# Patient Record
Sex: Male | Born: 1940 | Race: White | Hispanic: Refuse to answer | Marital: Married | Smoking: Never smoker
Health system: Southern US, Community
[De-identification: ages and names within clinical notes are randomized; demographics above are authoritative.]

## PROBLEM LIST (undated history)

## (undated) DIAGNOSIS — C61 Malignant neoplasm of prostate: Secondary | ICD-10-CM

## (undated) DIAGNOSIS — Z973 Presence of spectacles and contact lenses: Secondary | ICD-10-CM

## (undated) DIAGNOSIS — I10 Essential (primary) hypertension: Secondary | ICD-10-CM

## (undated) DIAGNOSIS — Z86010 Personal history of colonic polyps: Secondary | ICD-10-CM

## (undated) DIAGNOSIS — M1990 Unspecified osteoarthritis, unspecified site: Secondary | ICD-10-CM

## (undated) DIAGNOSIS — K4090 Unilateral inguinal hernia, without obstruction or gangrene, not specified as recurrent: Secondary | ICD-10-CM

## (undated) DIAGNOSIS — K5730 Diverticulosis of large intestine without perforation or abscess without bleeding: Secondary | ICD-10-CM

## (undated) DIAGNOSIS — E785 Hyperlipidemia, unspecified: Secondary | ICD-10-CM

## (undated) HISTORY — PX: COLONOSCOPY W/ POLYPECTOMY: SHX1380

## (undated) HISTORY — DX: Hyperlipidemia, unspecified: E78.5

## (undated) HISTORY — DX: Essential (primary) hypertension: I10

## (undated) HISTORY — PX: TONSILLECTOMY: SUR1361

---

## 2002-02-26 ENCOUNTER — Ambulatory Visit (HOSPITAL_COMMUNITY): Admission: RE | Admit: 2002-02-26 | Discharge: 2002-02-26 | Payer: Self-pay | Admitting: Internal Medicine

## 2005-03-07 ENCOUNTER — Ambulatory Visit: Payer: Self-pay | Admitting: Internal Medicine

## 2005-03-24 ENCOUNTER — Ambulatory Visit: Payer: Self-pay | Admitting: Internal Medicine

## 2010-03-04 ENCOUNTER — Encounter: Payer: Self-pay | Admitting: Internal Medicine

## 2010-08-09 NOTE — Letter (Signed)
Summary: Colonoscopy Letter  [facility]  [address] [place], [place] [address]   Phone: [phone]  Fax: [phone]      March 04, 2010 MRN: [id number]   Robert [name] [address] [place], [place]  [address]   Dear [name],   According to your medical record, it is time for you to schedule a Colonoscopy. The American Cancer Society recommends this procedure as a method to detect early colon cancer. Patients with a family history of colon cancer, or a personal history of colon polyps or inflammatory bowel disease are at increased risk.  This letter has been generated based on the recommendations made at the time of your procedure. If you feel that in your particular situation this may no longer apply, please contact our office.  Please call our office at [phone] to schedule this appointment or to update your records at your earliest convenience.  Thank you for cooperating with [place] to provide you with the very best care possible.   Sincerely,  Wilhemina Bonito. Marina Goodell, M.D.  [facility] [phone]

## 2012-03-26 ENCOUNTER — Encounter: Payer: Self-pay | Admitting: Internal Medicine

## 2012-04-02 ENCOUNTER — Encounter: Payer: Self-pay | Admitting: Internal Medicine

## 2014-07-29 ENCOUNTER — Encounter: Payer: Self-pay | Admitting: Internal Medicine

## 2014-08-04 ENCOUNTER — Ambulatory Visit (AMBULATORY_SURGERY_CENTER): Payer: Self-pay

## 2014-08-04 VITALS — Ht 70.0 in | Wt 215.6 lb

## 2014-08-04 DIAGNOSIS — Z86010 Personal history of colonic polyps: Secondary | ICD-10-CM

## 2014-08-04 MED ORDER — MOVIPREP 100 G PO SOLR: ORAL | Status: DC

## 2014-08-04 NOTE — Progress Notes (Signed)
Per pt, no allergies to soy or egg products.Pt not taking any weight loss meds or using  O2 at home. 

## 2014-08-12 ENCOUNTER — Encounter: Payer: Self-pay | Admitting: Internal Medicine

## 2014-08-12 ENCOUNTER — Ambulatory Visit (AMBULATORY_SURGERY_CENTER): Payer: Medicare PPO | Admitting: Internal Medicine

## 2014-08-12 VITALS — BP 103/73 | HR 73 | Temp 98.6°F | Resp 19 | Ht 70.0 in | Wt 215.0 lb

## 2014-08-12 DIAGNOSIS — K5730 Diverticulosis of large intestine without perforation or abscess without bleeding: Secondary | ICD-10-CM

## 2014-08-12 DIAGNOSIS — D122 Benign neoplasm of ascending colon: Secondary | ICD-10-CM

## 2014-08-12 DIAGNOSIS — Z86010 Personal history of colonic polyps: Secondary | ICD-10-CM

## 2014-08-12 DIAGNOSIS — Z1211 Encounter for screening for malignant neoplasm of colon: Secondary | ICD-10-CM | POA: Diagnosis not present

## 2014-08-12 HISTORY — DX: Diverticulosis of large intestine without perforation or abscess without bleeding: K57.30

## 2014-08-12 HISTORY — PX: COLONOSCOPY W/ POLYPECTOMY: SHX1380

## 2014-08-12 MED ORDER — SODIUM CHLORIDE 0.9 % IV SOLN
500.0000 mL | INTRAVENOUS | Status: DC
Start: 2014-08-12 — End: 2014-08-12

## 2014-08-12 NOTE — Op Note (Signed)
[place]  [address] [place], [address]   COLONOSCOPY PROCEDURE REPORT  PATIENT: [name], Robert  MR#: [id number] BIRTHDATE: Oct 16, 1940 , 73  yrs. old GENDER: male ENDOSCOPIST: Eustace Quail, MD REFERRED OZ:[id number] Program Recall PROCEDURE DATE:  08/12/2014 PROCEDURE:   Colonoscopy with snare polypectomy x 1 First Screening Colonoscopy - Avg.  risk and is 50 yrs.  old or older - No.  Prior Negative Screening - Now for repeat screening. N/A  History of Adenoma - Now for follow-up colonoscopy & has been > or = to 3 yrs.  Yes hx of adenoma.  Has been 3 or more years since last colonoscopy.  Polyps Removed Today? Yes. ASA CLASS:   Class II INDICATIONS:surveillance colonoscopy based on a history of adenomatous colonic polyp(s).. Prior examination 2003 (tubular adenoma) and 2006 (no neoplasia) MEDICATIONS: Monitored anesthesia care and Propofol 200 mg IV  DESCRIPTION OF PROCEDURE:   After the risks benefits and alternatives of the procedure were thoroughly explained, informed consent was obtained.  The digital rectal exam revealed no abnormalities of the rectum.   The LB [id number]  endoscope was introduced through the anus and advanced to the cecum, which was identified by both the appendix and ileocecal valve. No adverse events experienced.   The quality of the prep was excellent, using MoviPrep  The instrument was then slowly withdrawn as the colon was fully examined.  COLON FINDINGS: A single polyp measuring 5 mm in size was found in the ascending colon.  A polypectomy was performed with a cold snare.  The resection was complete, the polyp tissue was completely retrieved and sent to histology.   There was moderate diverticulosis noted in the sigmoid colon.   The examination was otherwise normal.  Retroflexed views revealed internal hemorrhoids. The time to cecum=1 minutes 59 seconds.  Withdrawal time=11 minutes 21 seconds.  The scope was  withdrawn and the procedure completed. COMPLICATIONS: There were no immediate complications.  ENDOSCOPIC IMPRESSION: 1.   Single polyp measuring 5 mm in size was found in the ascending colon; polypectomy was performed with a cold snare 2.   Moderate diverticulosis was noted in the sigmoid colon 3.   The examination was otherwise normal  RECOMMENDATIONS: 1. Follow up colonoscopy in 5 years  eSigned:  Eustace Quail, MD 08/12/2014 10:43 AM   cc: Leanna Battles, MD and The Patient

## 2014-08-12 NOTE — Patient Instructions (Signed)
YOU HAD AN ENDOSCOPIC PROCEDURE TODAY AT THE [facility]: Refer to the procedure report that was given to you for any specific questions about what was found during the examination.  If the procedure report does not answer your questions, please call your gastroenterologist to clarify.  If you requested that your care partner not be given the details of your procedure findings, then the procedure report has been included in a sealed envelope for you to review at your convenience later.  YOU SHOULD EXPECT: Some feelings of bloating in the abdomen. Passage of more gas than usual.  Walking can help get rid of the air that was put into your GI tract during the procedure and reduce the bloating. If you had a lower endoscopy (such as a colonoscopy or flexible sigmoidoscopy) you may notice spotting of blood in your stool or on the toilet paper. If you underwent a bowel prep for your procedure, then you may not have a normal bowel movement for a few days.  DIET: Your first meal following the procedure should be a light meal and then it is ok to progress to your normal diet.  A half-sandwich or bowl of soup is an example of a good first meal.  Heavy or fried foods are harder to digest and may make you feel nauseous or bloated.  Likewise meals heavy in dairy and vegetables can cause extra gas to form and this can also increase the bloating.  Drink plenty of fluids but you should avoid alcoholic beverages for 24 hours.  ACTIVITY: Your care partner should take you home directly after the procedure.  You should plan to take it easy, moving slowly for the rest of the day.  You can resume normal activity the day after the procedure however you should NOT DRIVE or use heavy machinery for 24 hours (because of the sedation medicines used during the test).    SYMPTOMS TO REPORT IMMEDIATELY: A gastroenterologist can be reached at any hour.  During normal business hours, 8:30 AM to 5:00 PM Monday through Friday,  call [phone].  After hours and on weekends, please call the GI answering service at [phone] who will take a message and have the physician on call contact you.   Following lower endoscopy (colonoscopy or flexible sigmoidoscopy):  Excessive amounts of blood in the stool  Significant tenderness or worsening of abdominal pains  Swelling of the abdomen that is new, acute  Fever of 100F or higher   FOLLOW UP: If any biopsies were taken you will be contacted by phone or by letter within the next 1-3 weeks.  Call your gastroenterologist if you have not heard about the biopsies in 3 weeks.  Our staff will call the home number listed on your records the next business day following your procedure to check on you and address any questions or concerns that you may have at that time regarding the information given to you following your procedure. This is a courtesy call and so if there is no answer at the home number and we have not heard from you through the emergency physician on call, we will assume that you have returned to your regular daily activities without incident.  SIGNATURES/CONFIDENTIALITY: You and/or your care partner have signed paperwork which will be entered into your electronic medical record.  These signatures attest to the fact that that the information above on your After Visit Summary has been reviewed and is understood.  Full responsibility of the confidentiality of   this discharge information lies with you and/or your care-partner.   Resume medications. Information given on polyps,diverticulosis and high fiber diet with discharge instructions. 

## 2014-08-12 NOTE — Progress Notes (Signed)
A/ox3 pleased with MAC, report to Sheila RN 

## 2014-08-12 NOTE — Progress Notes (Signed)
Called to room to assist during endoscopic procedure.  Patient ID and intended procedure confirmed with present staff. Received instructions for my participation in the procedure from the performing physician.  

## 2014-08-13 ENCOUNTER — Telehealth: Payer: Self-pay | Admitting: *Deleted

## 2014-08-13 NOTE — Telephone Encounter (Signed)
  Follow up Call-  Call back number 08/12/2014  Post procedure Call Back phone  # [phone]  Permission to leave phone message Yes     Patient questions:  Do you have a fever, pain , or abdominal swelling? No. Pain Score  0 *  Have you tolerated food without any problems? Yes.    Have you been able to return to your normal activities? Yes.    Do you have any questions about your discharge instructions: Diet   No. Medications  No. Follow up visit  No.  Do you have questions or concerns about your Care? No.  Actions: * If pain score is 4 or above: No action needed, pain <4.

## 2014-08-18 ENCOUNTER — Encounter: Payer: Self-pay | Admitting: Internal Medicine

## 2015-01-25 DIAGNOSIS — D1801 Hemangioma of skin and subcutaneous tissue: Secondary | ICD-10-CM | POA: Diagnosis not present

## 2015-01-25 DIAGNOSIS — L814 Other melanin hyperpigmentation: Secondary | ICD-10-CM | POA: Diagnosis not present

## 2015-01-25 DIAGNOSIS — L821 Other seborrheic keratosis: Secondary | ICD-10-CM | POA: Diagnosis not present

## 2015-01-25 DIAGNOSIS — D225 Melanocytic nevi of trunk: Secondary | ICD-10-CM | POA: Diagnosis not present

## 2015-04-03 DIAGNOSIS — Z23 Encounter for immunization: Secondary | ICD-10-CM | POA: Diagnosis not present

## 2015-08-02 DIAGNOSIS — R8299 Other abnormal findings in urine: Secondary | ICD-10-CM | POA: Diagnosis not present

## 2015-08-02 DIAGNOSIS — I10 Essential (primary) hypertension: Secondary | ICD-10-CM | POA: Diagnosis not present

## 2015-08-02 DIAGNOSIS — E784 Other hyperlipidemia: Secondary | ICD-10-CM | POA: Diagnosis not present

## 2015-08-02 DIAGNOSIS — R7302 Impaired glucose tolerance (oral): Secondary | ICD-10-CM | POA: Diagnosis not present

## 2015-08-02 DIAGNOSIS — Z125 Encounter for screening for malignant neoplasm of prostate: Secondary | ICD-10-CM | POA: Diagnosis not present

## 2015-08-09 DIAGNOSIS — Z6831 Body mass index (BMI) 31.0-31.9, adult: Secondary | ICD-10-CM | POA: Diagnosis not present

## 2015-08-09 DIAGNOSIS — I10 Essential (primary) hypertension: Secondary | ICD-10-CM | POA: Diagnosis not present

## 2015-08-09 DIAGNOSIS — Z1389 Encounter for screening for other disorder: Secondary | ICD-10-CM | POA: Diagnosis not present

## 2015-08-09 DIAGNOSIS — L989 Disorder of the skin and subcutaneous tissue, unspecified: Secondary | ICD-10-CM | POA: Diagnosis not present

## 2015-08-09 DIAGNOSIS — E784 Other hyperlipidemia: Secondary | ICD-10-CM | POA: Diagnosis not present

## 2015-08-09 DIAGNOSIS — R3129 Other microscopic hematuria: Secondary | ICD-10-CM | POA: Diagnosis not present

## 2015-08-09 DIAGNOSIS — E668 Other obesity: Secondary | ICD-10-CM | POA: Diagnosis not present

## 2015-08-09 DIAGNOSIS — R7302 Impaired glucose tolerance (oral): Secondary | ICD-10-CM | POA: Diagnosis not present

## 2015-08-09 DIAGNOSIS — Z0000 Encounter for general adult medical examination without abnormal findings: Secondary | ICD-10-CM | POA: Diagnosis not present

## 2015-08-16 DIAGNOSIS — Z1212 Encounter for screening for malignant neoplasm of rectum: Secondary | ICD-10-CM | POA: Diagnosis not present

## 2015-09-28 DIAGNOSIS — H524 Presbyopia: Secondary | ICD-10-CM | POA: Diagnosis not present

## 2015-09-28 DIAGNOSIS — H53021 Refractive amblyopia, right eye: Secondary | ICD-10-CM | POA: Diagnosis not present

## 2015-09-28 DIAGNOSIS — H25813 Combined forms of age-related cataract, bilateral: Secondary | ICD-10-CM | POA: Diagnosis not present

## 2016-03-25 DIAGNOSIS — Z23 Encounter for immunization: Secondary | ICD-10-CM | POA: Diagnosis not present

## 2016-08-07 DIAGNOSIS — I10 Essential (primary) hypertension: Secondary | ICD-10-CM | POA: Diagnosis not present

## 2016-08-07 DIAGNOSIS — Z125 Encounter for screening for malignant neoplasm of prostate: Secondary | ICD-10-CM | POA: Diagnosis not present

## 2016-08-07 DIAGNOSIS — R8299 Other abnormal findings in urine: Secondary | ICD-10-CM | POA: Diagnosis not present

## 2016-08-07 DIAGNOSIS — E784 Other hyperlipidemia: Secondary | ICD-10-CM | POA: Diagnosis not present

## 2016-08-07 DIAGNOSIS — R7302 Impaired glucose tolerance (oral): Secondary | ICD-10-CM | POA: Diagnosis not present

## 2016-08-14 DIAGNOSIS — E668 Other obesity: Secondary | ICD-10-CM | POA: Diagnosis not present

## 2016-08-14 DIAGNOSIS — R972 Elevated prostate specific antigen [PSA]: Secondary | ICD-10-CM | POA: Diagnosis not present

## 2016-08-14 DIAGNOSIS — E784 Other hyperlipidemia: Secondary | ICD-10-CM | POA: Diagnosis not present

## 2016-08-14 DIAGNOSIS — R7302 Impaired glucose tolerance (oral): Secondary | ICD-10-CM | POA: Diagnosis not present

## 2016-08-14 DIAGNOSIS — Z0000 Encounter for general adult medical examination without abnormal findings: Secondary | ICD-10-CM | POA: Diagnosis not present

## 2016-08-14 DIAGNOSIS — I10 Essential (primary) hypertension: Secondary | ICD-10-CM | POA: Diagnosis not present

## 2016-08-14 DIAGNOSIS — Z1389 Encounter for screening for other disorder: Secondary | ICD-10-CM | POA: Diagnosis not present

## 2016-08-14 DIAGNOSIS — Z6831 Body mass index (BMI) 31.0-31.9, adult: Secondary | ICD-10-CM | POA: Diagnosis not present

## 2016-08-16 DIAGNOSIS — Z1212 Encounter for screening for malignant neoplasm of rectum: Secondary | ICD-10-CM | POA: Diagnosis not present

## 2016-09-08 DIAGNOSIS — R3129 Other microscopic hematuria: Secondary | ICD-10-CM | POA: Diagnosis not present

## 2016-09-08 DIAGNOSIS — I10 Essential (primary) hypertension: Secondary | ICD-10-CM | POA: Diagnosis not present

## 2016-09-08 DIAGNOSIS — R972 Elevated prostate specific antigen [PSA]: Secondary | ICD-10-CM | POA: Diagnosis not present

## 2016-09-08 DIAGNOSIS — Z6831 Body mass index (BMI) 31.0-31.9, adult: Secondary | ICD-10-CM | POA: Diagnosis not present

## 2016-12-14 DIAGNOSIS — H53021 Refractive amblyopia, right eye: Secondary | ICD-10-CM | POA: Diagnosis not present

## 2016-12-14 DIAGNOSIS — H2513 Age-related nuclear cataract, bilateral: Secondary | ICD-10-CM | POA: Diagnosis not present

## 2016-12-14 DIAGNOSIS — H524 Presbyopia: Secondary | ICD-10-CM | POA: Diagnosis not present

## 2016-12-14 DIAGNOSIS — H5203 Hypermetropia, bilateral: Secondary | ICD-10-CM | POA: Diagnosis not present

## 2017-07-10 DIAGNOSIS — C61 Malignant neoplasm of prostate: Secondary | ICD-10-CM

## 2017-07-10 HISTORY — DX: Malignant neoplasm of prostate: C61

## 2017-08-10 DIAGNOSIS — E7849 Other hyperlipidemia: Secondary | ICD-10-CM | POA: Diagnosis not present

## 2017-08-10 DIAGNOSIS — R82998 Other abnormal findings in urine: Secondary | ICD-10-CM | POA: Diagnosis not present

## 2017-08-10 DIAGNOSIS — I10 Essential (primary) hypertension: Secondary | ICD-10-CM | POA: Diagnosis not present

## 2017-08-10 DIAGNOSIS — R7302 Impaired glucose tolerance (oral): Secondary | ICD-10-CM | POA: Diagnosis not present

## 2017-08-10 DIAGNOSIS — Z125 Encounter for screening for malignant neoplasm of prostate: Secondary | ICD-10-CM | POA: Diagnosis not present

## 2017-08-15 DIAGNOSIS — Z1212 Encounter for screening for malignant neoplasm of rectum: Secondary | ICD-10-CM | POA: Diagnosis not present

## 2017-08-17 DIAGNOSIS — Z6831 Body mass index (BMI) 31.0-31.9, adult: Secondary | ICD-10-CM | POA: Diagnosis not present

## 2017-08-17 DIAGNOSIS — Z0000 Encounter for general adult medical examination without abnormal findings: Secondary | ICD-10-CM | POA: Diagnosis not present

## 2017-08-17 DIAGNOSIS — Z1389 Encounter for screening for other disorder: Secondary | ICD-10-CM | POA: Diagnosis not present

## 2017-08-17 DIAGNOSIS — R7301 Impaired fasting glucose: Secondary | ICD-10-CM | POA: Diagnosis not present

## 2017-08-17 DIAGNOSIS — E7849 Other hyperlipidemia: Secondary | ICD-10-CM | POA: Diagnosis not present

## 2017-08-17 DIAGNOSIS — E668 Other obesity: Secondary | ICD-10-CM | POA: Diagnosis not present

## 2017-08-17 DIAGNOSIS — I10 Essential (primary) hypertension: Secondary | ICD-10-CM | POA: Diagnosis not present

## 2017-08-17 DIAGNOSIS — R972 Elevated prostate specific antigen [PSA]: Secondary | ICD-10-CM | POA: Diagnosis not present

## 2017-09-20 DIAGNOSIS — R311 Benign essential microscopic hematuria: Secondary | ICD-10-CM | POA: Diagnosis not present

## 2017-09-20 DIAGNOSIS — R972 Elevated prostate specific antigen [PSA]: Secondary | ICD-10-CM | POA: Diagnosis not present

## 2017-10-16 DIAGNOSIS — R972 Elevated prostate specific antigen [PSA]: Secondary | ICD-10-CM | POA: Diagnosis not present

## 2017-10-16 DIAGNOSIS — C61 Malignant neoplasm of prostate: Secondary | ICD-10-CM | POA: Diagnosis not present

## 2017-10-16 HISTORY — PX: PROSTATE BIOPSY: SHX241

## 2017-11-15 DIAGNOSIS — C61 Malignant neoplasm of prostate: Secondary | ICD-10-CM | POA: Diagnosis not present

## 2017-12-07 ENCOUNTER — Encounter: Payer: Self-pay | Admitting: Radiation Oncology

## 2017-12-07 NOTE — Progress Notes (Signed)
GU Location of Tumor / Histology: prostatic adenocarcinoma  If Prostate Cancer, Gleason Score is (3 + 4) and PSA is (7.17) on 09/10/2017. Prostate volume: 49 grams  Robert [name] has a history of BPH and microscopic hematuria in 2015. He underwent cystoscopy and ultrasound. Patient returned to Dr. Junious Silk in March 2019 at the advice of Dr. Leanna Battles for further evaluation of an elevated PSA.   Biopsies of prostate (if applicable) revealed:    Past/Anticipated interventions by urology, if any: cystoscopy, prostate biopsy, referral to radiation oncology  Past/Anticipated interventions by medical oncology, if any: no  Weight changes, if any: no  Bowel/Bladder complaints, if any: IPSS 3. Denies dysuria, hematuria, urinary leakage or incontinence.  Nausea/Vomiting, if any: no  Pain issues, if any:  See chiropractor regular for sciatica. Reports a sore low back from time to time.   SAFETY ISSUES:  Prior radiation? no  Pacemaker/ICD? no  Possible current pregnancy? no  Is the patient on methotrexate? no  Current Complaints / other details:  77 year old male. Married for 52 years.One daughter. 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

## 2017-12-10 ENCOUNTER — Encounter: Payer: Self-pay | Admitting: Radiation Oncology

## 2017-12-10 ENCOUNTER — Other Ambulatory Visit: Payer: Self-pay

## 2017-12-10 ENCOUNTER — Ambulatory Visit
Admission: RE | Admit: 2017-12-10 | Discharge: 2017-12-10 | Disposition: A | Discharge status: Home / Self Care | Payer: Medicare PPO | Source: Ambulatory Visit | Attending: Radiation Oncology | Admitting: Radiation Oncology

## 2017-12-10 ENCOUNTER — Encounter: Payer: Self-pay | Admitting: Medical Oncology

## 2017-12-10 VITALS — BP 133/86 | HR 81 | Temp 98.2°F | Resp 20 | Ht 69.5 in | Wt 208.4 lb

## 2017-12-10 DIAGNOSIS — Z79899 Other long term (current) drug therapy: Secondary | ICD-10-CM | POA: Insufficient documentation

## 2017-12-10 DIAGNOSIS — E785 Hyperlipidemia, unspecified: Secondary | ICD-10-CM | POA: Insufficient documentation

## 2017-12-10 DIAGNOSIS — C61 Malignant neoplasm of prostate: Secondary | ICD-10-CM | POA: Diagnosis not present

## 2017-12-10 DIAGNOSIS — Z87448 Personal history of other diseases of urinary system: Secondary | ICD-10-CM | POA: Diagnosis not present

## 2017-12-10 DIAGNOSIS — I10 Essential (primary) hypertension: Secondary | ICD-10-CM | POA: Diagnosis not present

## 2017-12-10 DIAGNOSIS — M545 Low back pain: Secondary | ICD-10-CM | POA: Diagnosis not present

## 2017-12-10 DIAGNOSIS — R972 Elevated prostate specific antigen [PSA]: Secondary | ICD-10-CM | POA: Diagnosis not present

## 2017-12-10 DIAGNOSIS — Z7982 Long term (current) use of aspirin: Secondary | ICD-10-CM | POA: Insufficient documentation

## 2017-12-10 HISTORY — DX: Malignant neoplasm of prostate: C61

## 2017-12-10 NOTE — Addendum Note (Signed)
Encounter addended by: Tyler Pita, MD on: 12/10/2017 8:39 PM  Actions taken: Medication List reviewed, Allergies reviewed, Problem List reviewed, LOS modified

## 2017-12-10 NOTE — Progress Notes (Signed)
Radiation Oncology         [phone] ________________________________  Initial outpatient Consultation  Name: Robert [name] MRN: [id number]  Date: 12/10/2017  DOB: Jan 20, 1941  CV:ELFYBOFB, Quillian Quince, MD  Festus Aloe, MD   REFERRING PHYSICIAN: Festus Aloe, MD  DIAGNOSIS: 77 y.o. gentleman with stage T1c adenocarcinoma of the prostate with a Gleason's score of 3+4 and a PSA of 7.17    ICD-10-CM   1. Malignant neoplasm of prostate Robert [name]) [facility] referral to Social Work    HISTORY OF PRESENT ILLNESS::Robert [name] is a 77 y.o. gentleman.  He was noted to have an elevated PSA of 7.17 by his primary care physician, Dr. Leanna Battles.  Accordingly, he was referred for evaluation in urology by Dr. Junious Silk in March who performed digital rectal exam that revealed estimated 40 g prostate but otherwise normal. The patient proceeded to transrectal ultrasound with 12 biopsies of the prostate on 10/16/2017.  The prostate volume measured to be 49 cc.  Out of 12 core biopsies, 3 were positive.  The maximum Gleason score was 3+4=7, and this was seen in the right apex.  The patient reviewed the biopsy results with his urologist and he has kindly been referred today for discussion of potential radiation treatment options.   On review of systems, patient has hx of BPH, microscopic hematuria evaluated with cystoscopy and [place] in 2015. Endorses intermittent low back pain.  PREVIOUS RADIATION THERAPY: No  PAST MEDICAL HISTORY:  has a past medical history of Hyperlipidemia, Hypertension, and Prostate cancer ([facility] Colon).    PAST SURGICAL HISTORY: Past Surgical History:  Procedure Laterality Date  . PROSTATE BIOPSY    . TONSILLECTOMY      FAMILY HISTORY: family history includes Cancer in his paternal grandfather.  SOCIAL HISTORY:  reports that he has never smoked. He has never used smokeless tobacco. He reports that he does not drink alcohol or use drugs.  ALLERGIES: Patient has no  known allergies.  MEDICATIONS:  Current Outpatient Medications  Medication Sig Dispense Refill  . aspirin 81 MG tablet Take 81 mg by mouth daily.    . metoprolol succinate (TOPROL-XL) 50 MG 24 hr tablet Take 50 mg by mouth daily. Take with or immediately following a meal.    . Multiple Vitamin (MULTIVITAMIN) tablet Take 1 tablet by mouth daily.    . simvastatin (ZOCOR) 40 MG tablet Take 40 mg by mouth daily.     No current facility-administered medications for this encounter.     REVIEW OF SYSTEMS:  A 15 point review of systems is documented in the electronic medical record. This was obtained by the nursing staff. However, I reviewed this with the patient to discuss relevant findings and make appropriate changes.  Pertinent items are noted in HPI..  The patient completed an IPSS and IIEF questionnaire.  His IPSS score was 3 indicating mild urinary outflow obstructive symptoms.  He indicated that his erectile function is almost never able to complete sexual activity.   PHYSICAL EXAM: This patient is in no acute distress.  He is alert and oriented.   height is 5' 9.5" (1.765 m) and weight is 208 lb 6.4 oz (94.5 kg). His oral temperature is 98.2 F (36.8 C). His blood pressure is 133/86 and his pulse is 81. His respiration is 20 and oxygen saturation is 97%.  He exhibits no respiratory distress or labored breathing.  He appears neurologically intact.  His mood is pleasant.  His affect is appropriate.  Please note the  digital rectal exam findings described above.  KPS = 100  100 - Normal; no complaints; no evidence of disease. 90   - Able to carry on normal activity; minor signs or symptoms of disease. 80   - Normal activity with effort; some signs or symptoms of disease. 31   - Cares for self; unable to carry on normal activity or to do active work. 60   - Requires occasional assistance, but is able to care for most of his personal needs. 50   - Requires considerable assistance and frequent  medical care. 37   - Disabled; requires special care and assistance. 46   - Severely disabled; hospital admission is indicated although death not imminent. 61   - Very sick; hospital admission necessary; active supportive treatment necessary. 10   - Moribund; fatal processes progressing rapidly. 0     - Dead  Karnofsky DA, Abelmann WH, Craver LS and Burchenal JH [phone]) The use of the nitrogen mustards in the palliative treatment of carcinoma: with particular reference to bronchogenic carcinoma Cancer 1 634-56   LABORATORY DATA:  No results found for: WBC, HGB, HCT, MCV, PLT No results found for: NA, K, CL, CO2 No results found for: ALT, AST, GGT, ALKPHOS, BILITOT   RADIOGRAPHY: No results found.    IMPRESSION: This gentleman is a 77 y.o. gentleman with stage T1c adenocarcinoma of the prostate with a Gleason's score of 3+4 and a PSA of 7.17.  His T-Stage, Gleason's Score, and PSA put him into the intermediate risk group.  Accordingly he is eligible for a variety of potential treatment options including  brachytherapy and external radiation therapy.   PLAN: Today I reviewed the findings and workup thus far.  We discussed the natural history of prostate cancer.  We reviewed the the implications of T-stage, Gleason's Score, and PSA on decision-making and outcomes in prostate cancer.  We discussed radiation treatment in the management of prostate cancer with regard to the logistics and delivery of external beam radiation treatment as well as the logistics and delivery of prostate brachytherapy.  We compared and contrasted each of these approaches and also compared these against prostatectomy.  The patient expressed interest in prostate brachytherapy.  I filled out a patient counseling form for him with relevant treatment diagrams and we retained a copy for our records.   The patient would like to proceed with prostate brachytherapy.  I will share my findings with Dr. Junious Silk and move forward with  scheduling the procedure in the near future.     I enjoyed meeting with him today, and will look forward to participating in the care of this very nice gentleman.   I spent time face to face with the patient and more than 50% of that time was spent in counseling and/or coordination of care.   ------------------------------------------------   Tyler Pita, MD [place] Director and Director of Stereotactic Radiosurgery Direct Dial: [phone]  Fax: [phone] [email or web address]  Skype  LinkedIn   Page Me   This document serves as a record of services personally performed by Tyler Pita, MD. It was created on his behalf by Linward Natal, a trained medical scribe. The creation of this record is based on the scribe's personal observations and the provider's statements to them. This document has been checked and approved by the attending provider.

## 2017-12-10 NOTE — Progress Notes (Signed)
Introduced myself to [name] and his daughter as the prostate nurse navigator and my role. He states his PSA has slowly been rising. He had a biopsy in March that reveal 3+4= 7. He is ready to get treatment and move on with his life. 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 He will meet with Dr. Tammi Klippel to discuss his options. If a candidate, he would like to pursue brachytherapy.

## 2017-12-10 NOTE — Progress Notes (Signed)
See progress note under physician encounter. 

## 2017-12-12 ENCOUNTER — Encounter: Payer: Self-pay | Admitting: General Practice

## 2017-12-12 NOTE — Progress Notes (Signed)
[facility] Psychosocial Distress Screening Clinical Social Work  Clinical Social Work was referred by distress screening protocol.  The patient scored a 5 on the Psychosocial Distress Thermometer which indicates moderate distress. Clinical Social Worker contacted patient by phone to assess for distress and other psychosocial needs. Patient is primary caregiver for wife; however, has daughter who helps out.  No concerns re transportation or services needed.  "I just want to get things going" but understands there will be a wait before his treatment can begin.  Reviewed briefly services available in [place], encouraged patient to call if needed.    ONCBCN DISTRESS SCREENING 12/10/2017  Screening Type Initial Screening  Distress experienced in past week (1-10) 5  Emotional problem type Nervousness/Anxiety  Physical Problem type Changes in urination  Physician notified of physical symptoms Yes  Referral to clinical psychology No  Referral to clinical social work No  Referral to dietition No  Referral to financial advocate No  Referral to support programs No  Referral to palliative care No  Other Primary caregiver for his wife. Phone number [phone]    Clinical Social Worker follow up needed: No.  If yes, follow up plan:  Beverely Pace, [place], LCSW Clinical Social Worker Phone:  [phone]

## 2017-12-14 ENCOUNTER — Telehealth: Payer: Self-pay | Admitting: *Deleted

## 2017-12-14 NOTE — Telephone Encounter (Signed)
Called patient to inform of pre-seed planning CT for 01-04-18, spoke with patient and he is aware of this appt.

## 2017-12-24 ENCOUNTER — Other Ambulatory Visit: Payer: Self-pay | Admitting: Urology

## 2018-01-03 ENCOUNTER — Telehealth: Payer: Self-pay | Admitting: *Deleted

## 2018-01-03 DIAGNOSIS — H25013 Cortical age-related cataract, bilateral: Secondary | ICD-10-CM | POA: Diagnosis not present

## 2018-01-03 DIAGNOSIS — H5203 Hypermetropia, bilateral: Secondary | ICD-10-CM | POA: Diagnosis not present

## 2018-01-03 DIAGNOSIS — H53021 Refractive amblyopia, right eye: Secondary | ICD-10-CM | POA: Diagnosis not present

## 2018-01-03 DIAGNOSIS — H52223 Regular astigmatism, bilateral: Secondary | ICD-10-CM | POA: Diagnosis not present

## 2018-01-03 DIAGNOSIS — H2513 Age-related nuclear cataract, bilateral: Secondary | ICD-10-CM | POA: Diagnosis not present

## 2018-01-03 NOTE — Telephone Encounter (Signed)
CALLED PATIENT TO REMIND OF PRE-SEED AND CHEST X-RAY AND EKG FOR 01-04-18, SPOKE WITH PATIENT AND HE IS AWARE OF THESE APPTS.

## 2018-01-04 ENCOUNTER — Ambulatory Visit
Admission: RE | Admit: 2018-01-04 | Discharge: 2018-01-04 | Disposition: A | Discharge status: Home / Self Care | Payer: Medicare PPO | Source: Ambulatory Visit | Attending: Radiation Oncology | Admitting: Radiation Oncology

## 2018-01-04 ENCOUNTER — Encounter (HOSPITAL_COMMUNITY)
Admission: RE | Admit: 2018-01-04 | Discharge: 2018-01-04 | Disposition: A | Discharge status: Home / Self Care | Payer: Medicare PPO | Source: Ambulatory Visit | Attending: Urology | Admitting: Urology

## 2018-01-04 ENCOUNTER — Ambulatory Visit (HOSPITAL_COMMUNITY)
Admission: RE | Admit: 2018-01-04 | Discharge: 2018-01-04 | Disposition: A | Discharge status: Home / Self Care | Payer: Medicare PPO | Source: Ambulatory Visit | Attending: Urology | Admitting: Urology

## 2018-01-04 VITALS — BP 119/85 | HR 91 | Temp 98.3°F | Resp 20 | Wt 208.0 lb

## 2018-01-04 DIAGNOSIS — C61 Malignant neoplasm of prostate: Secondary | ICD-10-CM

## 2018-01-04 DIAGNOSIS — R918 Other nonspecific abnormal finding of lung field: Secondary | ICD-10-CM | POA: Diagnosis not present

## 2018-01-04 DIAGNOSIS — Z01818 Encounter for other preprocedural examination: Secondary | ICD-10-CM | POA: Diagnosis not present

## 2018-01-04 NOTE — Progress Notes (Signed)
  Radiation Oncology         [phone]) [phone] ________________________________  Name: Robert [name] MRN: [id number]  Date: 01/04/2018  DOB: 01-05-1941  SIMULATION AND TREATMENT PLANNING NOTE PUBIC ARCH STUDY  GQ:[id number], Quillian Quince, MD  Festus Aloe, MD  DIAGNOSIS: 78 y.o. gentleman with stage T1c adenocarcinoma of the prostate with a Gleason's score of 3+4 and a PSA of 7.17     ICD-10-CM   1. Malignant neoplasm of prostate ([place]) C61     COMPLEX SIMULATION:  The patient presented today for evaluation for possible prostate seed implant. He was brought to the radiation planning suite and placed supine on the CT couch. A 3-dimensional image study set was obtained in upload to the planning computer. There, on each axial slice, I contoured the prostate gland. Then, using three-dimensional radiation planning tools I reconstructed the prostate in view of the structures from the transperineal needle pathway to assess for possible pubic arch interference. In doing so, I did not appreciate any pubic arch interference. Also, the patient's prostate volume was estimated based on the drawn structure. The volume was 52 cc.  For comparison, TRUS volume was 49 cc.  Given the pubic arch appearance and prostate volume, patient remains a good candidate to proceed with prostate seed implant. Today, he freely provided informed written consent to proceed.    PLAN: The patient will undergo prostate seed implant.   ________________________________  Sheral Apley. Tammi Klippel, M.D.

## 2018-02-19 ENCOUNTER — Other Ambulatory Visit: Payer: Self-pay | Admitting: Urology

## 2018-02-19 DIAGNOSIS — C61 Malignant neoplasm of prostate: Secondary | ICD-10-CM

## 2018-02-28 ENCOUNTER — Telehealth: Payer: Self-pay | Admitting: *Deleted

## 2018-02-28 NOTE — Telephone Encounter (Signed)
CALLED PATIENT TO REMIND OF LAB APPT. FOR 03-01-18 - ARRIVAL TIME - 8:45 AM @ [facility] ADMITTING, LVM FOR A RETURN CALL

## 2018-03-01 ENCOUNTER — Other Ambulatory Visit: Payer: Self-pay

## 2018-03-01 ENCOUNTER — Encounter (HOSPITAL_COMMUNITY)
Admission: RE | Admit: 2018-03-01 | Discharge: 2018-03-01 | Disposition: A | Discharge status: Home / Self Care | Payer: Medicare PPO | Source: Ambulatory Visit | Attending: Urology | Admitting: Urology

## 2018-03-01 ENCOUNTER — Encounter (HOSPITAL_BASED_OUTPATIENT_CLINIC_OR_DEPARTMENT_OTHER): Payer: Self-pay

## 2018-03-01 DIAGNOSIS — C61 Malignant neoplasm of prostate: Secondary | ICD-10-CM | POA: Diagnosis not present

## 2018-03-01 LAB — CBC
HEMATOCRIT: 44.9 % (ref 39.0–52.0)
HEMOGLOBIN: 15.5 g/dL (ref 13.0–17.0)
MCH: 30.8 pg (ref 26.0–34.0)
MCHC: 34.5 g/dL (ref 30.0–36.0)
MCV: 89.3 fL (ref 78.0–100.0)
Platelets: 250 10*3/uL (ref 150–400)
RBC: 5.03 MIL/uL (ref 4.22–5.81)
RDW: 12.8 % (ref 11.5–15.5)
WBC: 6.3 10*3/uL (ref 4.0–10.5)

## 2018-03-01 LAB — COMPREHENSIVE METABOLIC PANEL
ALBUMIN: 4.3 g/dL (ref 3.5–5.0)
ALK PHOS: 65 U/L (ref 38–126)
ALT: 24 U/L (ref 0–44)
AST: 30 U/L (ref 15–41)
Anion gap: 10 (ref 5–15)
BILIRUBIN TOTAL: 0.6 mg/dL (ref 0.3–1.2)
BUN: 22 mg/dL (ref 8–23)
CALCIUM: 9.3 mg/dL (ref 8.9–10.3)
CO2: 27 mmol/L (ref 22–32)
CREATININE: 0.96 mg/dL (ref 0.61–1.24)
Chloride: 105 mmol/L (ref 98–111)
GFR calc Af Amer: >60 mL/min (ref >60)
GFR calc non Af Amer: >60 mL/min (ref >60)
GLUCOSE: 106 mg/dL — AB (ref 70–99)
Potassium: 4 mmol/L (ref 3.5–5.1)
SODIUM: 142 mmol/L (ref 135–145)
TOTAL PROTEIN: 7.5 g/dL (ref 6.5–8.1)

## 2018-03-01 LAB — APTT: aPTT: 33 seconds (ref 24–36)

## 2018-03-01 LAB — PROTIME-INR
INR: 0.97
Prothrombin Time: 12.7 seconds (ref 11.4–15.2)

## 2018-03-01 NOTE — Progress Notes (Signed)
Spoke with:  Jori Moll NPO:  After Midnight, no gum, candy, or mints   Arrival time: 11:00AM Labs:  (EKG, CXR 01/04/2018, CBC, CMP, PT, PTT 03/01/2018 in epic) AM medications: Metoprolol Pre op orders: Yes Ride home:  Anderson Malta (daughter) [phone]

## 2018-03-07 ENCOUNTER — Telehealth: Payer: Self-pay | Admitting: *Deleted

## 2018-03-07 NOTE — Telephone Encounter (Signed)
CALLED PATIENT TO REMIND OF PROCEDURE FOR 03-08-18, LVM FOR A RETURN CALL

## 2018-03-08 ENCOUNTER — Ambulatory Visit (HOSPITAL_BASED_OUTPATIENT_CLINIC_OR_DEPARTMENT_OTHER): Payer: Medicare PPO | Admitting: Anesthesiology

## 2018-03-08 ENCOUNTER — Encounter (HOSPITAL_BASED_OUTPATIENT_CLINIC_OR_DEPARTMENT_OTHER)
Admission: RE | Disposition: A | Discharge status: Home / Self Care | Payer: Self-pay | Source: Ambulatory Visit | Attending: Urology

## 2018-03-08 ENCOUNTER — Other Ambulatory Visit: Payer: Self-pay

## 2018-03-08 ENCOUNTER — Encounter (HOSPITAL_BASED_OUTPATIENT_CLINIC_OR_DEPARTMENT_OTHER): Payer: Self-pay | Admitting: Anesthesiology

## 2018-03-08 ENCOUNTER — Ambulatory Visit (HOSPITAL_BASED_OUTPATIENT_CLINIC_OR_DEPARTMENT_OTHER)
Admission: RE | Admit: 2018-03-08 | Discharge: 2018-03-08 | Disposition: A | Discharge status: Home / Self Care | Payer: Medicare PPO | Source: Ambulatory Visit | Attending: Urology | Admitting: Urology

## 2018-03-08 ENCOUNTER — Ambulatory Visit (HOSPITAL_COMMUNITY): Payer: Medicare PPO

## 2018-03-08 DIAGNOSIS — E785 Hyperlipidemia, unspecified: Secondary | ICD-10-CM | POA: Insufficient documentation

## 2018-03-08 DIAGNOSIS — Z86010 Personal history of colonic polyps: Secondary | ICD-10-CM | POA: Diagnosis not present

## 2018-03-08 DIAGNOSIS — C61 Malignant neoplasm of prostate: Secondary | ICD-10-CM | POA: Diagnosis not present

## 2018-03-08 DIAGNOSIS — I10 Essential (primary) hypertension: Secondary | ICD-10-CM | POA: Insufficient documentation

## 2018-03-08 HISTORY — DX: Presence of spectacles and contact lenses: Z97.3

## 2018-03-08 HISTORY — PX: CYSTOSCOPY: SHX5120

## 2018-03-08 HISTORY — DX: Diverticulosis of large intestine without perforation or abscess without bleeding: K57.30

## 2018-03-08 HISTORY — PX: RADIOACTIVE SEED IMPLANT: SHX5150

## 2018-03-08 HISTORY — DX: Personal history of colonic polyps: Z86.010

## 2018-03-08 HISTORY — PX: SPACE OAR INSTILLATION: SHX6769

## 2018-03-08 SURGERY — INSERTION, RADIATION SOURCE, PROSTATE
Anesthesia: General | Site: Rectum

## 2018-03-08 MED ORDER — TAMSULOSIN HCL 0.4 MG PO CAPS
0.4000 mg | ORAL_CAPSULE | Freq: Every day | ORAL | Status: DC
  Administered 2018-03-08: 0.4 mg via ORAL
  Filled 2018-03-08: qty 1

## 2018-03-08 MED ORDER — PHENYLEPHRINE 40 MCG/ML (10ML) SYRINGE FOR IV PUSH (FOR BLOOD PRESSURE SUPPORT)
PREFILLED_SYRINGE | INTRAVENOUS | Status: AC
  Filled 2018-03-08: qty 10

## 2018-03-08 MED ORDER — LIDOCAINE 2% (20 MG/ML) 5 ML SYRINGE
INTRAMUSCULAR | Status: AC
  Filled 2018-03-08: qty 5

## 2018-03-08 MED ORDER — SODIUM CHLORIDE 0.9 % IJ SOLN
INTRAMUSCULAR | Status: DC | PRN
  Administered 2018-03-08: 10 mL

## 2018-03-08 MED ORDER — LACTATED RINGERS IV SOLN
INTRAVENOUS | Status: DC
  Administered 2018-03-08 (×2): via INTRAVENOUS
  Filled 2018-03-08: qty 1000

## 2018-03-08 MED ORDER — FENTANYL CITRATE (PF) 100 MCG/2ML IJ SOLN
25.0000 ug | INTRAMUSCULAR | Status: DC | PRN
  Filled 2018-03-08: qty 1

## 2018-03-08 MED ORDER — IOHEXOL 300 MG/ML  SOLN
INTRAMUSCULAR | Status: DC | PRN
  Administered 2018-03-08: 7 mL

## 2018-03-08 MED ORDER — LACTATED RINGERS IV SOLN
INTRAVENOUS | Status: DC
  Filled 2018-03-08: qty 1000

## 2018-03-08 MED ORDER — PROPOFOL 10 MG/ML IV BOLUS
INTRAVENOUS | Status: DC | PRN
  Administered 2018-03-08: 170 mg via INTRAVENOUS
  Administered 2018-03-08 (×2): 20 mg via INTRAVENOUS

## 2018-03-08 MED ORDER — FENTANYL CITRATE (PF) 100 MCG/2ML IJ SOLN
INTRAMUSCULAR | Status: DC | PRN
  Administered 2018-03-08: 25 ug via INTRAVENOUS
  Administered 2018-03-08: 50 ug via INTRAVENOUS
  Administered 2018-03-08 (×3): 25 ug via INTRAVENOUS
  Administered 2018-03-08: 50 ug via INTRAVENOUS

## 2018-03-08 MED ORDER — FLEET ENEMA 7-19 GM/118ML RE ENEM
1.0000 | ENEMA | Freq: Once | RECTAL | Status: AC
  Administered 2018-03-08: 1 via RECTAL
  Filled 2018-03-08: qty 1

## 2018-03-08 MED ORDER — OXYCODONE-ACETAMINOPHEN 5-325 MG PO TABS: 1.0000 | ORAL_TABLET | Freq: Four times a day (QID) | ORAL | 0 refills | Status: AC | PRN

## 2018-03-08 MED ORDER — ONDANSETRON HCL 4 MG/2ML IJ SOLN
INTRAMUSCULAR | Status: DC | PRN
  Administered 2018-03-08: 4 mg via INTRAVENOUS

## 2018-03-08 MED ORDER — DEXAMETHASONE SODIUM PHOSPHATE 4 MG/ML IJ SOLN
INTRAMUSCULAR | Status: DC | PRN
  Administered 2018-03-08: 10 mg via INTRAVENOUS

## 2018-03-08 MED ORDER — EPHEDRINE SULFATE-NACL 50-0.9 MG/10ML-% IV SOSY
PREFILLED_SYRINGE | INTRAVENOUS | Status: DC | PRN
  Administered 2018-03-08 (×2): 10 mg via INTRAVENOUS
  Administered 2018-03-08 (×2): 5 mg via INTRAVENOUS

## 2018-03-08 MED ORDER — OXYCODONE HCL 5 MG PO TABS
5.0000 mg | ORAL_TABLET | Freq: Once | ORAL | Status: DC | PRN
  Filled 2018-03-08: qty 1

## 2018-03-08 MED ORDER — OXYCODONE HCL 5 MG/5ML PO SOLN
5.0000 mg | Freq: Once | ORAL | Status: DC | PRN
  Filled 2018-03-08: qty 5

## 2018-03-08 MED ORDER — CIPROFLOXACIN IN D5W 400 MG/200ML IV SOLN
INTRAVENOUS | Status: AC
  Filled 2018-03-08: qty 200

## 2018-03-08 MED ORDER — DOCUSATE SODIUM 100 MG PO CAPS: 100.0000 mg | ORAL_CAPSULE | Freq: Two times a day (BID) | ORAL | 0 refills | Status: DC

## 2018-03-08 MED ORDER — LIDOCAINE 2% (20 MG/ML) 5 ML SYRINGE
INTRAMUSCULAR | Status: DC | PRN
  Administered 2018-03-08: 100 mg via INTRAVENOUS

## 2018-03-08 MED ORDER — PROMETHAZINE HCL 25 MG/ML IJ SOLN
6.2500 mg | INTRAMUSCULAR | Status: DC | PRN
  Filled 2018-03-08: qty 1

## 2018-03-08 MED ORDER — SODIUM CHLORIDE 0.9 % IR SOLN
Status: DC | PRN
  Administered 2018-03-08: 1000 mL via INTRAVESICAL

## 2018-03-08 MED ORDER — PHENYLEPHRINE 40 MCG/ML (10ML) SYRINGE FOR IV PUSH (FOR BLOOD PRESSURE SUPPORT)
PREFILLED_SYRINGE | INTRAVENOUS | Status: DC | PRN
  Administered 2018-03-08: 40 ug via INTRAVENOUS
  Administered 2018-03-08 (×3): 80 ug via INTRAVENOUS
  Administered 2018-03-08: 40 ug via INTRAVENOUS
  Administered 2018-03-08: 80 ug via INTRAVENOUS

## 2018-03-08 MED ORDER — TAMSULOSIN HCL 0.4 MG PO CAPS
ORAL_CAPSULE | ORAL | Status: AC
  Filled 2018-03-08: qty 1

## 2018-03-08 MED ORDER — FENTANYL CITRATE (PF) 100 MCG/2ML IJ SOLN
INTRAMUSCULAR | Status: AC
  Filled 2018-03-08: qty 2

## 2018-03-08 MED ORDER — CIPROFLOXACIN IN D5W 400 MG/200ML IV SOLN
400.0000 mg | INTRAVENOUS | Status: AC
  Administered 2018-03-08 (×2): 400 mg via INTRAVENOUS
  Filled 2018-03-08: qty 200

## 2018-03-08 MED ORDER — MEPERIDINE HCL 25 MG/ML IJ SOLN
6.2500 mg | INTRAMUSCULAR | Status: DC | PRN
  Filled 2018-03-08: qty 1

## 2018-03-08 MED ORDER — TAMSULOSIN HCL 0.4 MG PO CAPS: 0.4000 mg | ORAL_CAPSULE | Freq: Every day | ORAL | 1 refills | Status: DC

## 2018-03-08 SURGICAL SUPPLY — 43 items
BAG URINE DRAINAGE (UROLOGICAL SUPPLIES) ×5 IMPLANT
BLADE CLIPPER SURG (BLADE) ×5 IMPLANT
CATH FOLEY 2WAY SLVR  5CC 16FR (CATHETERS) ×2
CATH FOLEY 2WAY SLVR 5CC 16FR (CATHETERS) ×3 IMPLANT
CATH ROBINSON RED A/P 16FR (CATHETERS) IMPLANT
CATH ROBINSON RED A/P 20FR (CATHETERS) ×5 IMPLANT
CLOTH BEACON ORANGE TIMEOUT ST (SAFETY) ×5 IMPLANT
CONT SPECI 4OZ STER CLIK (MISCELLANEOUS) ×10 IMPLANT
COVER BACK TABLE 60X90IN (DRAPES) ×5 IMPLANT
COVER MAYO STAND STRL (DRAPES) ×5 IMPLANT
DRSG TEGADERM 4X4.75 (GAUZE/BANDAGES/DRESSINGS) ×8 IMPLANT
DRSG TEGADERM 8X12 (GAUZE/BANDAGES/DRESSINGS) ×10 IMPLANT
GAUZE SPONGE 4X4 12PLY STRL (GAUZE/BANDAGES/DRESSINGS) ×2 IMPLANT
GLOVE BIO SURGEON STRL SZ 6 (GLOVE) IMPLANT
GLOVE BIO SURGEON STRL SZ7 (GLOVE) IMPLANT
GLOVE BIO SURGEON STRL SZ7.5 (GLOVE) ×10 IMPLANT
GLOVE BIO SURGEON STRL SZ8 (GLOVE) IMPLANT
GLOVE BIOGEL PI IND STRL 6 (GLOVE) IMPLANT
GLOVE BIOGEL PI IND STRL 8 (GLOVE) IMPLANT
GLOVE BIOGEL PI INDICATOR 6 (GLOVE)
GLOVE BIOGEL PI INDICATOR 8 (GLOVE) ×2
GLOVE ECLIPSE 8.0 STRL XLNG CF (GLOVE) ×5 IMPLANT
GLOVE INDICATOR 7.0 STRL GRN (GLOVE) IMPLANT
GLOVE SURG SS PI 8.0 STRL IVOR (GLOVE) ×2 IMPLANT
GOWN STRL REUS W/ TWL LRG LVL3 (GOWN DISPOSABLE) ×3 IMPLANT
GOWN STRL REUS W/ TWL XL LVL3 (GOWN DISPOSABLE) ×3 IMPLANT
GOWN STRL REUS W/TWL LRG LVL3 (GOWN DISPOSABLE) ×5
GOWN STRL REUS W/TWL XL LVL3 (GOWN DISPOSABLE) ×10 IMPLANT
HOLDER FOLEY CATH W/STRAP (MISCELLANEOUS) ×5 IMPLANT
I-SEED AGX100 ×172 IMPLANT
IMPL SPACEOAR SYSTEM 10ML (MISCELLANEOUS) ×3 IMPLANT
IMPLANT SPACEOAR SYSTEM 10ML (MISCELLANEOUS) ×5
IV NS 1000ML (IV SOLUTION) ×5
IV NS 1000ML BAXH (IV SOLUTION) ×3 IMPLANT
KIT TURNOVER CYSTO (KITS) ×5 IMPLANT
MARKER SKIN DUAL TIP RULER LAB (MISCELLANEOUS) ×5 IMPLANT
PACK CYSTO (CUSTOM PROCEDURE TRAY) ×5 IMPLANT
SURGILUBE 2OZ TUBE FLIPTOP (MISCELLANEOUS) ×5 IMPLANT
SYR 10ML LL (SYRINGE) ×5 IMPLANT
TUBE CONNECTING 12'X1/4 (SUCTIONS)
TUBE CONNECTING 12X1/4 (SUCTIONS) IMPLANT
UNDERPAD 30X30 (UNDERPADS AND DIAPERS) ×10 IMPLANT
WATER STERILE IRR 500ML POUR (IV SOLUTION) ×5 IMPLANT

## 2018-03-08 NOTE — Anesthesia Procedure Notes (Signed)
Procedure Name: LMA Insertion Date/Time: 03/08/2018 1:38 PM Performed by: Lieutenant Diego, CRNA Pre-anesthesia Checklist: Patient identified, Emergency Drugs available, Suction available and Patient being monitored Patient Re-evaluated:Patient Re-evaluated prior to induction Oxygen Delivery Method: Circle system utilized Preoxygenation: Pre-oxygenation with 100% oxygen Induction Type: IV induction Ventilation: Mask ventilation without difficulty LMA: LMA inserted LMA Size: 5.0 Number of attempts: 1 Placement Confirmation: positive ETCO2 and breath sounds checked- equal and bilateral Tube secured with: Tape Dental Injury: Teeth and Oropharynx as per pre-operative assessment

## 2018-03-08 NOTE — H&P (Signed)
Urology Admission H&P  Chief Complaint: Prostate cancer  History of Present Illness: 77 year old white male who presents today for brachytherapy and biodegradable gel insertion.  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  He had a normal DRE.  His AUA symptom score was 3.  He is well today without dysuria or gross hematuria.  He feels well and has had no recent fever or congestion.  Past Medical History:  Diagnosis Date  . Diverticulosis, sigmoid 08/12/2014   Moderate, noted on colonoscopy  . History of colon polyps   . Hyperlipidemia   . Hypertension   . Prostate cancer ([place])   . Wears glasses    Past Surgical History:  Procedure Laterality Date  . COLONOSCOPY W/ POLYPECTOMY  08/12/2014  . PROSTATE BIOPSY  10/16/2017  . TONSILLECTOMY      Home Medications:  Current Facility-Administered Medications  Medication Dose Route Frequency Provider Last Rate Last Dose  . lactated ringers infusion   Intravenous Continuous Effie Berkshire, MD 50 mL/hr at 03/08/18 1216     Allergies: No Known Allergies  Family History  Problem Relation Age of Onset  . Cancer Paternal Grandfather        unknown/bone   Social History:  reports that he has never smoked. He has never used smokeless tobacco. He reports that he drank alcohol. He reports that he does not use drugs.  Review of Systems  All other systems reviewed and are negative.   Physical Exam:  Vital signs in last 24 hours: Temp:  [98.6 F (37 C)] 98.6 F (37 C) (08/30 1103) Pulse Rate:  [94] 94 (08/30 1103) Resp:  [16] 16 (08/30 1103) BP: (131)/(91) 131/91 (08/30 1103) SpO2:  [98 %] 98 % (08/30 1103) Weight:  [93.4 kg] 93.4 kg (08/30 1103) Physical Exam No acute distress Alert and oriented talking to his family and laughing Cardiovascular-regular rate and rhythm Respiratory-regular effort and depth Abdomen-soft and nontender Extremity-no lower  extremity swelling or pain Neuro-no focal deficit  Laboratory Data:  No results found for this or any previous visit (from the past 24 hour(s)). No results found for this or any previous visit (from the past 240 hour(s)). Creatinine: No results for input(s): CREATININE in the last 168 hours.   Impression/Assessment:  Prostate cancer  Plan:  I discussed with the patient the nature, potential benefits, risks and alternatives to prostate brachytherapy (permanent LDR) and biodegradable gel insertion and cystoscopy, including side effects of the proposed treatment, the likelihood of the patient achieving the goals of the procedure, and any potential problems that might occur during the procedure or recuperation. All questions answered. Patient elects to proceed.    Festus Aloe 03/08/2018, 1:14 PM

## 2018-03-08 NOTE — Op Note (Addendum)
Preoperative diagnosis: Stage I (T1cNxMx) Prostate cancer Postoperative diagnosis: Same  Procedure: Prostate brachytherapy seed implant, biodegradable gel injection, Cystoscopy  Surgeon: Junious Silk  Radiation oncologist: Tammi Klippel  Anesthesia: Gen.  Indication for procedure: 77 year old with stage I prostate cancer who elected to proceed with prostate brachytherapy.  Findings: On fluoroscopic imaging there was adequate coverage of the prostate. On cystoscopy the urethra appeared normal, the prostatic urethra appeared normal, the trigone and ureteral orifices appeared normal with clear efflux. The bladder mucosa appeared normal. There were no stones, foreign bodies or seeds visualized in the bladder.  Dose: 86 active sources and 30 needles  Description of procedure: After consent was obtained patient brought to the operating room. After adequate anesthesia he is placed in lithotomy position and the transrectal ultrasound probe and perineal template positioned. Catheters and brachytherapy seeds were placed per Dr. Johny Shears plan. A total of 30 catheters and 86 active sources (I-125) were placed. The anchoring needles, template and ultrasound were removed. Scout flouro imaging was obtained of the implant. The Foley was removed. Another flouro image was obtained.   The 18-gauge needle was then inserted approximately 1 to 2 cm anterior to the anal opening and directed under fluoroscopic guidance into the perirectal fat between the anterior rectal wall and the prostate capsule down to the mid-gland. Midline needle position was confirmed in the sagittal and axial views to verify the tip was in the perirectal fat.  Small amounts of saline were injected to hydrodissect the space between the prostate and the anterior rectal wall.  Axial imaging was viewed to confirm the needle was in the correct location in the mid gland and centered.  Aspiration confirmed no intravascular access.  The saline syringe  was carefully disconnected maintaining the desired needle position and the hydrogel was attached to the needle.  Under ultrasound guidance in the sagittal view a smooth continuous injection was done over 10- 12 seconds delivering the hydrogel into the space between the prostate and rectal wall.  The needle was withdrawn.  The patient was prepped again and cystoscopy was performed which was noted to be normal. A 16 French catheter was placed and left gravity drainage. He was awakened taken to the recovery room in stable condition.  Complications: None  Blood loss: Minimal  Specimens: None  Drains: None  Disposition: Patient stable to PACU.

## 2018-03-08 NOTE — Anesthesia Preprocedure Evaluation (Addendum)
Anesthesia Evaluation  Patient identified by MRN, date of birth, ID band Patient awake    Reviewed: Allergy & Precautions, NPO status , Patient's Chart, lab work & pertinent test results  Airway Mallampati: III  TM Distance: >3 FB Neck ROM: Full    Dental  (+) Chipped, Dental Advisory Given,    Pulmonary neg pulmonary ROS,    breath sounds clear to auscultation       Cardiovascular hypertension, Pt. on home beta blockers  Rhythm:Regular Rate:Normal     Neuro/Psych negative neurological ROS     GI/Hepatic negative GI ROS, Neg liver ROS,   Endo/Other  negative endocrine ROS  Renal/GU negative Renal ROS     Musculoskeletal   Abdominal Normal abdominal exam  (+)   Peds  Hematology negative hematology ROS (+)   Anesthesia Other Findings - HLD  Reproductive/Obstetrics                            Anesthesia Physical Anesthesia Plan  ASA: II  Anesthesia Plan: General   Post-op Pain Management:    Induction: Intravenous  PONV Risk Score and Plan: 3 and Ondansetron, Dexamethasone and Treatment may vary due to age or medical condition  Airway Management Planned: LMA  Additional Equipment: None  Intra-op Plan:   Post-operative Plan: Extubation in OR  Informed Consent: I have reviewed the patients History and Physical, chart, labs and discussed the procedure including the risks, benefits and alternatives for the proposed anesthesia with the patient or authorized representative who has indicated his/her understanding and acceptance.   Dental advisory given  Plan Discussed with: CRNA  Anesthesia Plan Comments:         Anesthesia Quick Evaluation

## 2018-03-08 NOTE — Discharge Instructions (Signed)
Brachytherapy for Prostate Cancer, Care After Refer to this sheet in the next few weeks. These instructions provide you with information on caring for yourself after your procedure. Your health care provider may also give you more specific instructions. Your treatment has been planned according to current medical practices, but problems sometimes occur. Call your health care provider if you have any problems or questions after your procedure. What can I expect after the procedure? The area behind the scrotum will probably be tender and bruised. For a short period of time you may have:  Difficulty passing urine. You may need a catheter for a few days to a month.  Blood in the urine or semen.  A feeling of constipation because of prostate swelling.  Frequent feeling of an urgent need to urinate.  For a long period of time you may have:  Inflammation of the rectum. This happens in about 2% of people who have the procedure, but may be lower in your case. A biodegradable gel was placed between the prostate and the rectum to prevent inflammation.  Erection problems. These vary with age and occur in about 15-40% of men.  Difficulty urinating. This is caused by scarring in the urethra.  Diarrhea.  Follow these instructions at home:  Take medicines only as directed by your health care provider.  You will probably have a catheter in your bladder for several days. You will have blood in the urine bag and should drink a lot of fluids to keep it a light red color.  Keep all follow-up visits as directed by your health care provider. If you have a catheter, it will be removed during one of these visits.  Try not to sit directly on the area behind the scrotum. A soft cushion can decrease the discomfort. Ice packs may also be helpful for the discomfort. Do not put ice directly on the skin.  Shower and wash the area behind the scrotum gently. Do not sit in a tub.  If you have had the brachytherapy  that uses the seeds, limit your close contact with children and pregnant women for 2 months because of the radiation still in the prostate. After that period of time, the levels drop off quickly. Get help right away if:  You have a fever.  You have chills.  You have shortness of breath.  You have chest pain.  You have thick blood, like tomato juice, in the urine bag.  Your catheter is blocked so urine cannot get into the bag. Your bladder area or lower abdomen may be swollen.  There is excessive bleeding from your rectum. It is normal to have a little blood mixed with your stool.  There is severe discomfort in the treated area that does not go away with pain medicine.  You have abdominal discomfort.  You have severe nausea or vomiting.  You develop any new or unusual symptoms. This information is not intended to replace advice given to you by your health care provider. Make sure you discuss any questions you have with your health care provider. Document Released: 07/29/2010 Document Revised: 12/08/2015 Document Reviewed: 12/17/2012 Elsevier Interactive Patient Education  2017 [facility] Instructions  Activity: Get plenty of rest for the remainder of the day. A responsible adult should stay with you for 24 hours following the procedure.  For the next 24 hours, DO NOT: -Drive a car -Paediatric nurse -Drink alcoholic beverages -Take any medication unless instructed by your physician -Make  any legal decisions or sign important papers.  Meals: Start with liquid foods such as gelatin or soup. Progress to regular foods as tolerated. Avoid greasy, spicy, heavy foods. If nausea and/or vomiting occur, drink only clear liquids until the nausea and/or vomiting subsides. Call your physician if vomiting continues.  Special Instructions/Symptoms: Your throat may feel dry or sore from the anesthesia or the breathing tube placed in your throat during  surgery. If this causes discomfort, gargle with warm salt water. The discomfort should disappear within 24 hours.  If you had a scopolamine patch placed behind your ear for the management of post- operative nausea and/or vomiting:  1. The medication in the patch is effective for 72 hours, after which it should be removed.  Wrap patch in a tissue and discard in the trash. Wash hands thoroughly with soap and water. 2. You may remove the patch earlier than 72 hours if you experience unpleasant side effects which may include dry mouth, dizziness or visual disturbances. 3. Avoid touching the patch. Wash your hands with soap and water after contact with the patch.

## 2018-03-08 NOTE — Transfer of Care (Addendum)
  Last Vitals:  Vitals Value Taken Time  BP 126/79 03/08/2018  3:03 PM  Temp    Pulse 97 03/08/2018  3:07 PM  Resp 19 03/08/2018  3:07 PM  SpO2 100 % 03/08/2018  3:07 PM  Vitals shown include unvalidated device data.  Last Pain:  Vitals:   03/08/18 1200  TempSrc:   PainSc: 0-No pain      Patients Stated Pain Goal: 5 (03/08/18 1200)  Immediate Anesthesia Transfer of Care Note  Patient: Robert [name]  Procedure(s) Performed: Procedure(s) (LRB): RADIOACTIVE SEED IMPLANT/BRACHYTHERAPY IMPLANT (N/A) SPACE OAR INSTILLATION (N/A) CYSTOSCOPY (N/A)  Patient Location: PACU  Anesthesia Type: General  Level of Consciousness: sleepy  Airway & Oxygen Therapy: Patient Spontanous Breathing and Patient connected to face mask oxygen, oral airway removed.  Post-op Assessment: Report given to PACU RN and Post -op Vital signs reviewed and stable  Post vital signs: Reviewed and stable  Complications: No apparent anesthesia complications

## 2018-03-11 NOTE — Progress Notes (Signed)
  Radiation Oncology         [phone] ________________________________  Name: Robert [name] MRN: [id number]  Date: 03/11/2018  DOB: 10/26/1940       Prostate Seed Implant  FX:[id number], Quillian Quince, MD  No ref. provider found  DIAGNOSIS: 77 y.o. gentleman with stage T1c adenocarcinoma of the prostate with a Gleason's score of 3+4 and a PSA of 7.17    ICD-10-CM   1. Prostate cancer ([place]) C61 DG Chest 2 View    DG Chest 2 View    Discharge patient    PROCEDURE: Insertion of radioactive I-125 seeds into the prostate gland.  RADIATION DOSE: 145 Gy, definitive therapy.  TECHNIQUE: Robert [name] was brought to the operating room with the urologist. He was placed in the dorsolithotomy position. He was catheterized and a rectal tube was inserted. The perineum was shaved, prepped and draped. The ultrasound probe was then introduced into the rectum to see the prostate gland.  TREATMENT DEVICE: A needle grid was attached to the ultrasound probe stand and anchor needles were placed.  3D PLANNING: The prostate was imaged in 3D using a sagittal sweep of the prostate probe. These images were transferred to the planning computer. There, the prostate, urethra and rectum were defined on each axial reconstructed image. Then, the software created an optimized 3D plan and a few seed positions were adjusted. The quality of the plan was reviewed using [facility] information for the target and the following two organs at risk:  Urethra and Rectum.  Then the accepted plan was printed and handed off to the radiation therapist.  Under my supervision, the custom loading of the seeds and spacers was carried out and loaded into sealed vicryl sleeves.  These pre-loaded needles were then placed into the needle holder.Marland Kitchen  PROSTATE VOLUME STUDY:  Using transrectal ultrasound the volume of the prostate was verified to be 55.6 cc.  SPECIAL TREATMENT PROCEDURE/SUPERVISION AND HANDLING: The pre-loaded needles were then delivered under  sagittal guidance. A total of 30 needles were used to deposit 86 seeds in the prostate gland. The individual seed activity was 0.483 mCi.  SpaceOAR:  Yes  COMPLEX SIMULATION: At the end of the procedure, an anterior radiograph of the pelvis was obtained to document seed positioning and count. Cystoscopy was performed to check the urethra and bladder.  MICRODOSIMETRY: At the end of the procedure, the patient was emitting 0.152 mR/hr at 1 meter. Accordingly, he was considered safe for hospital discharge.  PLAN: The patient will return to the radiation oncology clinic for post implant CT dosimetry in three weeks.   ________________________________  Sheral Apley Tammi Klippel, M.D.

## 2018-03-12 ENCOUNTER — Encounter (HOSPITAL_BASED_OUTPATIENT_CLINIC_OR_DEPARTMENT_OTHER): Payer: Self-pay | Admitting: Urology

## 2018-03-13 NOTE — Anesthesia Postprocedure Evaluation (Signed)
Anesthesia Post Note  Patient: Robert [name]  Procedure(s) Performed: RADIOACTIVE SEED IMPLANT/BRACHYTHERAPY IMPLANT (N/A Prostate) SPACE OAR INSTILLATION (N/A Rectum) CYSTOSCOPY (N/A Bladder)     Patient location during evaluation: PACU Anesthesia Type: General Level of consciousness: awake Pain management: pain level controlled Vital Signs Assessment: post-procedure vital signs reviewed and stable Respiratory status: spontaneous breathing Cardiovascular status: stable Postop Assessment: no apparent nausea or vomiting Anesthetic complications: no    Last Vitals:  Vitals:   03/08/18 1545 03/08/18 1632  BP: (!) 132/92 121/84  Pulse: 97 92  Resp: 20 20  Temp:  37.2 C  SpO2: 98% 97%    Last Pain:  Vitals:   03/12/18 0938  TempSrc:   PainSc: 0-No pain   Pain Goal: Patients Stated Pain Goal: 5 (03/08/18 1200)               Maia Handa JR,JOHN Mateo Flow

## 2018-03-21 ENCOUNTER — Ambulatory Visit
Admission: RE | Admit: 2018-03-21 | Discharge: 2018-03-21 | Disposition: A | Discharge status: Home / Self Care | Payer: Medicare PPO | Source: Ambulatory Visit | Attending: Radiation Oncology | Admitting: Radiation Oncology

## 2018-03-21 ENCOUNTER — Encounter: Payer: Self-pay | Admitting: Medical Oncology

## 2018-03-21 ENCOUNTER — Other Ambulatory Visit: Payer: Self-pay

## 2018-03-21 ENCOUNTER — Encounter: Payer: Self-pay | Admitting: Radiation Oncology

## 2018-03-21 ENCOUNTER — Ambulatory Visit (HOSPITAL_COMMUNITY)
Admission: RE | Admit: 2018-03-21 | Discharge: 2018-03-21 | Disposition: A | Discharge status: Home / Self Care | Payer: Medicare PPO | Source: Ambulatory Visit | Attending: Urology | Admitting: Urology

## 2018-03-21 DIAGNOSIS — Z79899 Other long term (current) drug therapy: Secondary | ICD-10-CM | POA: Diagnosis not present

## 2018-03-21 DIAGNOSIS — C61 Malignant neoplasm of prostate: Secondary | ICD-10-CM | POA: Diagnosis not present

## 2018-03-21 DIAGNOSIS — Z7982 Long term (current) use of aspirin: Secondary | ICD-10-CM | POA: Diagnosis not present

## 2018-03-21 DIAGNOSIS — Z923 Personal history of irradiation: Secondary | ICD-10-CM | POA: Insufficient documentation

## 2018-03-21 NOTE — Progress Notes (Signed)
  Radiation Oncology         [phone] ________________________________  Name: GAYLE COLLARD MRN: [id number]  Date: 03/21/2018  DOB: Jun 12, 1941  COMPLEX SIMULATION NOTE  NARRATIVE:  The patient was brought to the [facility] today following prostate seed implantation approximately one month ago.  Identity was confirmed.  All relevant records and images related to the planned course of therapy were reviewed.  Then, the patient was set-up supine.  CT images were obtained.  The CT images were loaded into the planning software.  Then the prostate and rectum were contoured.  Treatment planning then occurred.  The implanted iodine 125 seeds were identified by the physics staff for projection of radiation distribution  I have requested : 3D Simulation  I have requested a DVH of the following structures: Prostate and rectum.    ________________________________  Sheral Apley Tammi Klippel, M.D.  This document serves as a record of services personally performed by Tyler Pita, MD. It was created on his behalf by Rae Lips, a trained medical scribe. The creation of this record is based on the scribe's personal observations and the provider's statements to them. This document has been checked and approved by the attending provider.

## 2018-03-21 NOTE — Progress Notes (Signed)
Pre seed IPSS 3. Post seed IPSS 8. Denies hematuria. Denies bowel complaints. Reports urinary urgency and occasional leakage. Reports dysuria resolved. Reports urinary frequency. Scheduled to follow up with urologist tomorrow at 0800. Scheduled for MRI later today.  BP 128/86   Pulse 86   Temp 98.2 F (36.8 C)   Resp 18   Ht 5\' 10"  (1.778 m)   Wt 208 lb (94.3 kg)   SpO2 99%   BMI 29.84 kg/m  Wt Readings from Last 3 Encounters:  03/21/18 208 lb (94.3 kg)  03/08/18 205 lb 12.8 oz (93.4 kg)  01/04/18 208 lb (94.3 kg)

## 2018-03-21 NOTE — Progress Notes (Signed)
Robert [name] states he is doing well post seed implant. He continues to having urinary frequency but urgency but all other symptoms have resolved. He has follow up with Dr. Audree Bane tomorrow.

## 2018-03-21 NOTE — Progress Notes (Signed)
Radiation Oncology         [phone] ________________________________  Name: Robert [name] MRN: [id number]  Date: 03/21/2018  DOB: 11/11/1940  Post-Seed Follow-Up Visit Note  CC: Leanna Battles, MD  Festus Aloe, MD  Diagnosis:   77 y.o. gentleman with stage T1c adenocarcinoma of the prostate with a Gleason's score of 3+4 and a PSA of 7.17    ICD-10-CM   1. Malignant neoplasm of prostate ([place]) C61     Interval Since Last Radiation:  2 weeks - Insertion of radioactive I-125 seeds into the prostate gland, 145 Gy definitive therapy, with SpaceOAR  Narrative:  The patient returns today for routine follow-up.  He is complaining of increased urinary frequency and urinary hesitation symptoms. He filled out a questionnaire regarding urinary function today providing an overall IPSS score of 8 characterizing his symptoms as moderate.  His pre-implant score was 3. He reports urinary urgency and occasional leakage. He states that he had hematuria initially in the first two days following surgery, but this has resolved on its own. He reports resolved dysuria. He states that he had constipation initially in the first two days following surgery but denies any current bowel symptoms. He is scheduled for MRI later today.  ALLERGIES:  has No Known Allergies.  Meds: Current Outpatient Medications  Medication Sig Dispense Refill  . aspirin 81 MG tablet Take 81 mg by mouth daily.    Marland Kitchen docusate sodium (COLACE) 100 MG capsule Take 1 capsule (100 mg total) by mouth 2 (two) times daily. 28 capsule 0  . metoprolol succinate (TOPROL-XL) 50 MG 24 hr tablet Take 50 mg by mouth daily. Take with or immediately following a meal.    . Multiple Vitamin (MULTIVITAMIN) tablet Take 1 tablet by mouth daily.    . simvastatin (ZOCOR) 40 MG tablet Take 40 mg by mouth every evening.     . tamsulosin (FLOMAX) 0.4 MG CAPS capsule Take 1 capsule (0.4 mg total) by mouth daily after supper. 30 capsule 1  .  oxyCODONE-acetaminophen (PERCOCET) 5-325 MG tablet Take 1 tablet by mouth every 6 (six) hours as needed for severe pain. (Patient not taking: Reported on 03/21/2018) 12 tablet 0   No current facility-administered medications for this encounter.     Physical Findings: In general this is a well appearing caucasian male in no acute distress. He's alert and oriented x4 and appropriate throughout the examination. Cardiopulmonary assessment is negative for acute distress and he exhibits normal effort.   Lab Findings: Lab Results  Component Value Date   WBC 6.3 03/01/2018   HGB 15.5 03/01/2018   HCT 44.9 03/01/2018   MCV 89.3 03/01/2018   PLT 250 03/01/2018    Radiographic Findings:  Patient underwent CT imaging in our clinic for post implant dosimetry. The CT images were personally reviewed by Dr. Tammi Klippel and appear to demonstrate an adequate distribution of radioactive seeds throughout the prostate gland. There are no seeds in or near the rectum.  He is scheduled for an MRI prostate later today and these images will be fused with his CT images for further evaluation. We suspect the final radiation plan and dosimetry will show appropriate coverage of the prostate gland.   Impression/Plan: The patient is recovering from the effects of radiation. His urinary symptoms should gradually improve over the next 4-6 months. We talked about this today. He is encouraged by his improvement already and is otherwise pleased with his outcome. We also talked about long-term follow-up for prostate cancer  following seed implant. He understands that ongoing PSA determinations and digital rectal exams will help perform surveillance to rule out disease recurrence. He has a follow up appointment scheduled at [facility]rology on 03/22/2018. He understands what to expect with his PSA measures. Patient was also educated today about some of the long-term effects from radiation including a small risk for rectal bleeding and  possibly erectile dysfunction. We talked about some of the general management approaches to these potential complications. However, I did encourage the patient to contact our office or return at any point if he has questions or concerns related to his previous radiation and prostate cancer.    Nicholos Johns, PA-C    Tyler Pita, MD  [facility] Direct Dial: [phone]  Fax: [phone] [email or web address]  Skype  [email or web address]    Page Me   This document serves as a record of services personally performed by Tyler Pita, MD and Freeman Caldron, PA-C. It was created on their behalf by Rae Lips, a trained medical scribe. The creation of this record is based on the scribe's personal observations and the providers' statements to them. This document has been checked and approved by the attending providers.

## 2018-03-22 DIAGNOSIS — C61 Malignant neoplasm of prostate: Secondary | ICD-10-CM | POA: Diagnosis not present

## 2018-03-22 DIAGNOSIS — R8271 Bacteriuria: Secondary | ICD-10-CM | POA: Diagnosis not present

## 2018-04-09 ENCOUNTER — Ambulatory Visit
Admission: RE | Admit: 2018-04-09 | Discharge: 2018-04-09 | Disposition: A | Discharge status: Home / Self Care | Payer: Medicare PPO | Source: Ambulatory Visit | Attending: Radiation Oncology | Admitting: Radiation Oncology

## 2018-04-09 DIAGNOSIS — C61 Malignant neoplasm of prostate: Secondary | ICD-10-CM | POA: Insufficient documentation

## 2018-04-16 ENCOUNTER — Encounter: Payer: Self-pay | Admitting: Radiation Oncology

## 2018-04-30 DIAGNOSIS — L814 Other melanin hyperpigmentation: Secondary | ICD-10-CM | POA: Diagnosis not present

## 2018-04-30 DIAGNOSIS — D1801 Hemangioma of skin and subcutaneous tissue: Secondary | ICD-10-CM | POA: Diagnosis not present

## 2018-04-30 DIAGNOSIS — L821 Other seborrheic keratosis: Secondary | ICD-10-CM | POA: Diagnosis not present

## 2018-04-30 DIAGNOSIS — D229 Melanocytic nevi, unspecified: Secondary | ICD-10-CM | POA: Diagnosis not present

## 2018-04-30 DIAGNOSIS — L570 Actinic keratosis: Secondary | ICD-10-CM | POA: Diagnosis not present

## 2018-05-06 NOTE — Progress Notes (Signed)
  Radiation Oncology         [phone] ________________________________  Name: Robert [name] MRN: [id number]  Date: 04/16/2018  DOB: 1941/06/26  3D Planning Note   Prostate Brachytherapy Post-Implant Dosimetry  Diagnosis: 77 y.o. gentleman with stage T1c adenocarcinoma of the prostate with a Gleason's score of 3+4 and a PSA of 7.17  Narrative: On a previous date, Robert [name] returned following prostate seed implantation for post implant planning. He underwent CT scan complex simulation to delineate the three-dimensional structures of the pelvis and demonstrate the radiation distribution.  Since that time, the seed localization, and complex isodose planning with dose volume histograms have now been completed.  Results:   Prostate Coverage - The dose of radiation delivered to the 90% or more of the prostate gland (D90) was 105.3% of the prescription dose. This exceeds our goal of greater than 90%. Rectal Sparing - The volume of rectal tissue receiving the prescription dose or higher was 0.0 cc. This falls under our thresholds tolerance of 1.0 cc.  Impression: The prostate seed implant appears to show adequate target coverage and appropriate rectal sparing.  Plan:  The patient will continue to follow with urology for ongoing PSA determinations. I would anticipate a high likelihood for local tumor control with minimal risk for rectal morbidity.  ________________________________  Sheral Apley Tammi Klippel, M.D.

## 2018-05-28 DIAGNOSIS — C61 Malignant neoplasm of prostate: Secondary | ICD-10-CM | POA: Diagnosis not present

## 2018-06-05 DIAGNOSIS — R3915 Urgency of urination: Secondary | ICD-10-CM | POA: Diagnosis not present

## 2018-06-05 DIAGNOSIS — C61 Malignant neoplasm of prostate: Secondary | ICD-10-CM | POA: Diagnosis not present

## 2018-08-16 DIAGNOSIS — R82998 Other abnormal findings in urine: Secondary | ICD-10-CM | POA: Diagnosis not present

## 2018-08-16 DIAGNOSIS — Z125 Encounter for screening for malignant neoplasm of prostate: Secondary | ICD-10-CM | POA: Diagnosis not present

## 2018-08-16 DIAGNOSIS — E7849 Other hyperlipidemia: Secondary | ICD-10-CM | POA: Diagnosis not present

## 2018-08-16 DIAGNOSIS — R7301 Impaired fasting glucose: Secondary | ICD-10-CM | POA: Diagnosis not present

## 2018-08-16 DIAGNOSIS — I10 Essential (primary) hypertension: Secondary | ICD-10-CM | POA: Diagnosis not present

## 2018-08-23 DIAGNOSIS — I10 Essential (primary) hypertension: Secondary | ICD-10-CM | POA: Diagnosis not present

## 2018-08-23 DIAGNOSIS — Z0000 Encounter for general adult medical examination without abnormal findings: Secondary | ICD-10-CM | POA: Diagnosis not present

## 2018-08-23 DIAGNOSIS — Z1331 Encounter for screening for depression: Secondary | ICD-10-CM | POA: Diagnosis not present

## 2018-08-23 DIAGNOSIS — R7301 Impaired fasting glucose: Secondary | ICD-10-CM | POA: Diagnosis not present

## 2018-08-23 DIAGNOSIS — E668 Other obesity: Secondary | ICD-10-CM | POA: Diagnosis not present

## 2018-08-23 DIAGNOSIS — Z6831 Body mass index (BMI) 31.0-31.9, adult: Secondary | ICD-10-CM | POA: Diagnosis not present

## 2018-08-23 DIAGNOSIS — C61 Malignant neoplasm of prostate: Secondary | ICD-10-CM | POA: Diagnosis not present

## 2018-08-23 DIAGNOSIS — E7849 Other hyperlipidemia: Secondary | ICD-10-CM | POA: Diagnosis not present

## 2018-08-27 DIAGNOSIS — C61 Malignant neoplasm of prostate: Secondary | ICD-10-CM | POA: Diagnosis not present

## 2018-08-30 DIAGNOSIS — Z1212 Encounter for screening for malignant neoplasm of rectum: Secondary | ICD-10-CM | POA: Diagnosis not present

## 2018-09-03 DIAGNOSIS — R3915 Urgency of urination: Secondary | ICD-10-CM | POA: Diagnosis not present

## 2018-09-03 DIAGNOSIS — Z8546 Personal history of malignant neoplasm of prostate: Secondary | ICD-10-CM | POA: Diagnosis not present

## 2019-03-05 DIAGNOSIS — Z8546 Personal history of malignant neoplasm of prostate: Secondary | ICD-10-CM | POA: Diagnosis not present

## 2019-03-12 DIAGNOSIS — R3915 Urgency of urination: Secondary | ICD-10-CM | POA: Diagnosis not present

## 2019-03-12 DIAGNOSIS — Z8546 Personal history of malignant neoplasm of prostate: Secondary | ICD-10-CM | POA: Diagnosis not present

## 2019-05-20 DIAGNOSIS — H53021 Refractive amblyopia, right eye: Secondary | ICD-10-CM | POA: Diagnosis not present

## 2019-05-20 DIAGNOSIS — H524 Presbyopia: Secondary | ICD-10-CM | POA: Diagnosis not present

## 2019-05-20 DIAGNOSIS — H25013 Cortical age-related cataract, bilateral: Secondary | ICD-10-CM | POA: Diagnosis not present

## 2019-05-20 DIAGNOSIS — H2513 Age-related nuclear cataract, bilateral: Secondary | ICD-10-CM | POA: Diagnosis not present

## 2019-05-20 DIAGNOSIS — H52223 Regular astigmatism, bilateral: Secondary | ICD-10-CM | POA: Diagnosis not present

## 2019-05-20 DIAGNOSIS — H5203 Hypermetropia, bilateral: Secondary | ICD-10-CM | POA: Diagnosis not present

## 2019-08-21 DIAGNOSIS — D229 Melanocytic nevi, unspecified: Secondary | ICD-10-CM | POA: Diagnosis not present

## 2019-08-21 DIAGNOSIS — L989 Disorder of the skin and subcutaneous tissue, unspecified: Secondary | ICD-10-CM | POA: Diagnosis not present

## 2019-08-21 DIAGNOSIS — D485 Neoplasm of uncertain behavior of skin: Secondary | ICD-10-CM | POA: Diagnosis not present

## 2019-08-21 DIAGNOSIS — L821 Other seborrheic keratosis: Secondary | ICD-10-CM | POA: Diagnosis not present

## 2019-08-21 DIAGNOSIS — L814 Other melanin hyperpigmentation: Secondary | ICD-10-CM | POA: Diagnosis not present

## 2019-08-25 DIAGNOSIS — Z0000 Encounter for general adult medical examination without abnormal findings: Secondary | ICD-10-CM | POA: Diagnosis not present

## 2019-08-25 DIAGNOSIS — Z125 Encounter for screening for malignant neoplasm of prostate: Secondary | ICD-10-CM | POA: Diagnosis not present

## 2019-08-25 DIAGNOSIS — R7301 Impaired fasting glucose: Secondary | ICD-10-CM | POA: Diagnosis not present

## 2019-08-25 DIAGNOSIS — E7849 Other hyperlipidemia: Secondary | ICD-10-CM | POA: Diagnosis not present

## 2019-08-27 DIAGNOSIS — I10 Essential (primary) hypertension: Secondary | ICD-10-CM | POA: Diagnosis not present

## 2019-08-27 DIAGNOSIS — R82998 Other abnormal findings in urine: Secondary | ICD-10-CM | POA: Diagnosis not present

## 2019-09-01 DIAGNOSIS — R7301 Impaired fasting glucose: Secondary | ICD-10-CM | POA: Diagnosis not present

## 2019-09-01 DIAGNOSIS — E785 Hyperlipidemia, unspecified: Secondary | ICD-10-CM | POA: Diagnosis not present

## 2019-09-01 DIAGNOSIS — E669 Obesity, unspecified: Secondary | ICD-10-CM | POA: Diagnosis not present

## 2019-09-01 DIAGNOSIS — Z0000 Encounter for general adult medical examination without abnormal findings: Secondary | ICD-10-CM | POA: Diagnosis not present

## 2019-09-01 DIAGNOSIS — C61 Malignant neoplasm of prostate: Secondary | ICD-10-CM | POA: Diagnosis not present

## 2019-09-01 DIAGNOSIS — I10 Essential (primary) hypertension: Secondary | ICD-10-CM | POA: Diagnosis not present

## 2019-09-01 DIAGNOSIS — Z86010 Personal history of colonic polyps: Secondary | ICD-10-CM | POA: Diagnosis not present

## 2019-09-22 DIAGNOSIS — Z1212 Encounter for screening for malignant neoplasm of rectum: Secondary | ICD-10-CM | POA: Diagnosis not present

## 2019-10-17 ENCOUNTER — Encounter: Payer: Self-pay | Admitting: Internal Medicine

## 2019-11-07 IMAGING — CR DG CHEST 2V
2 series · 2 of 2 positions shown · non-contrast
Comparison: None.

CLINICAL DATA: Prostate cancer, preoperative for prostate surgery

EXAM:
CHEST - 2 VIEW

[w chest pa]
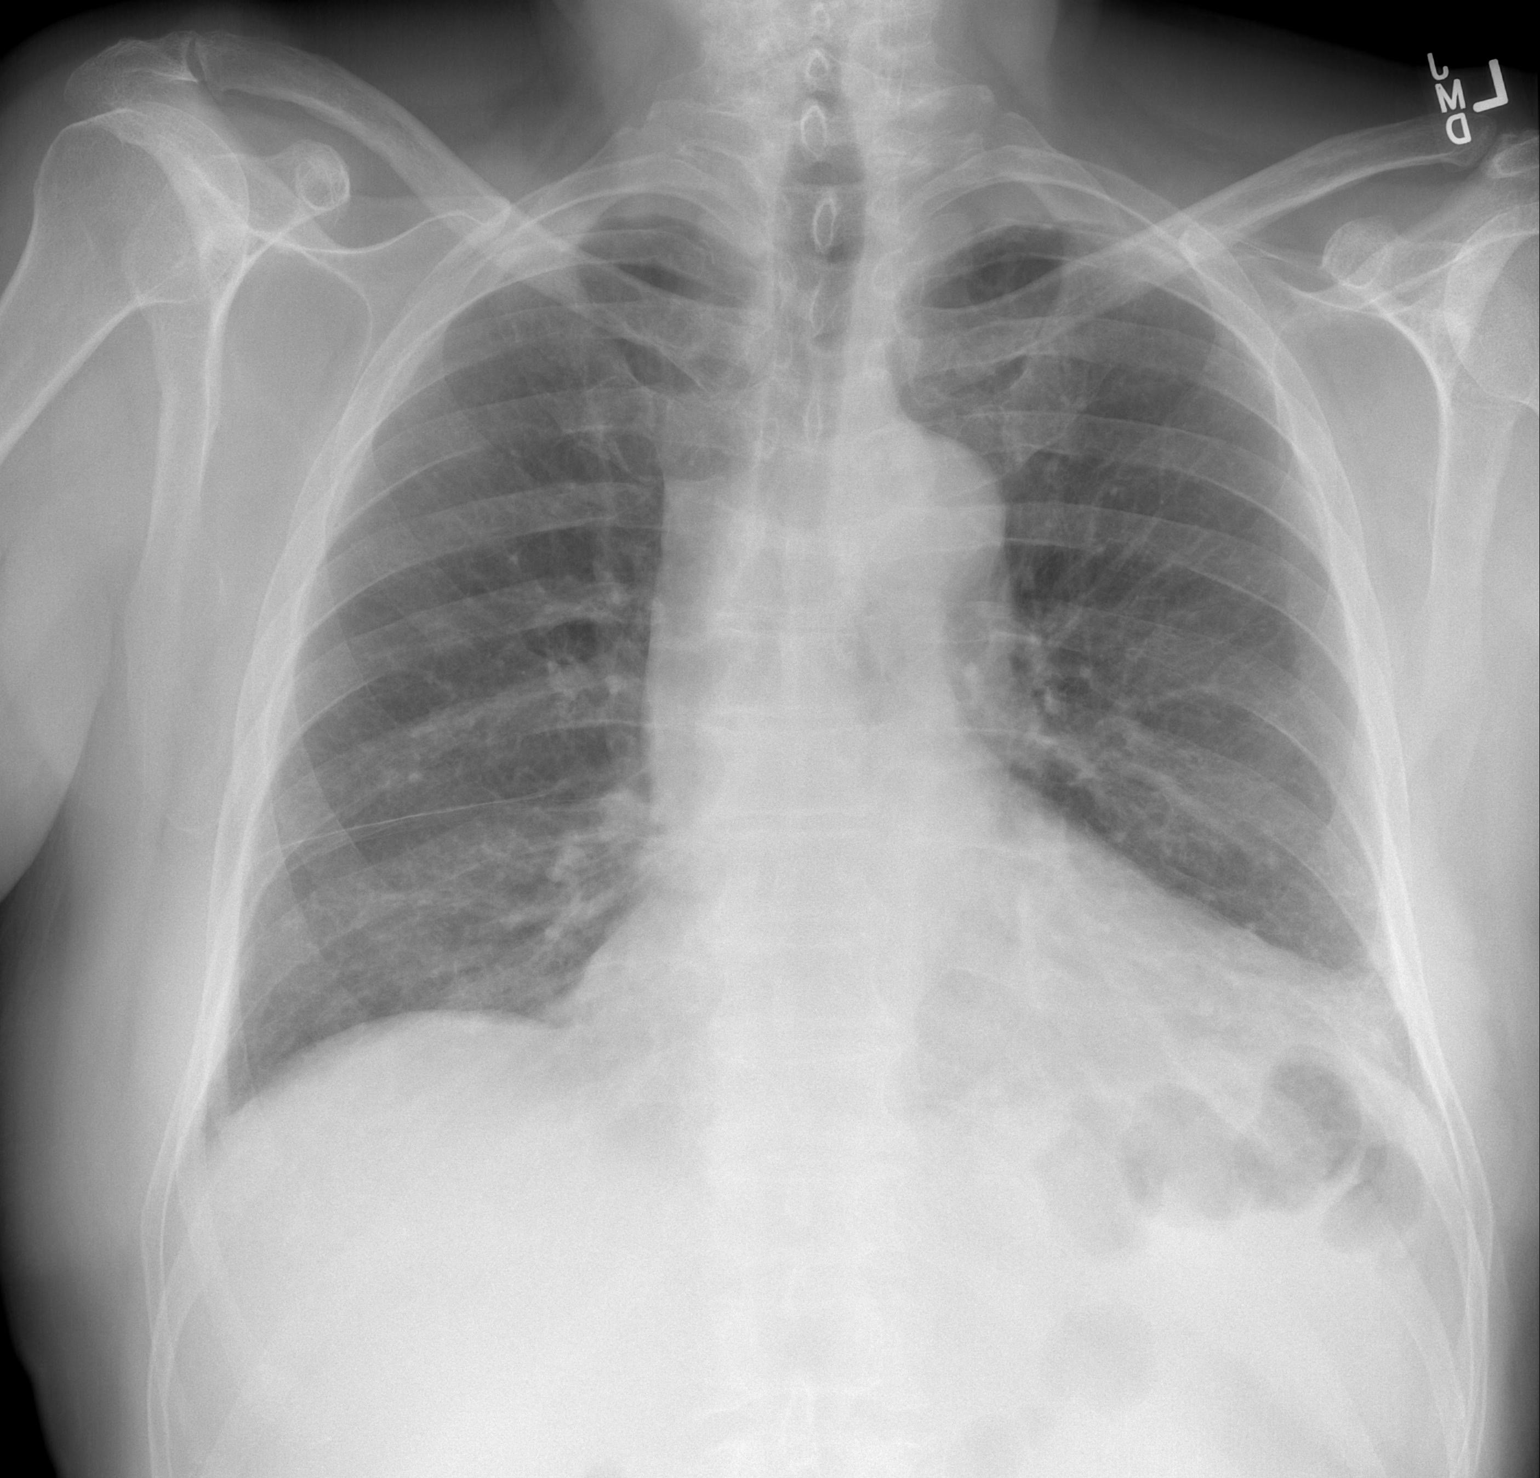

[w chest lat]
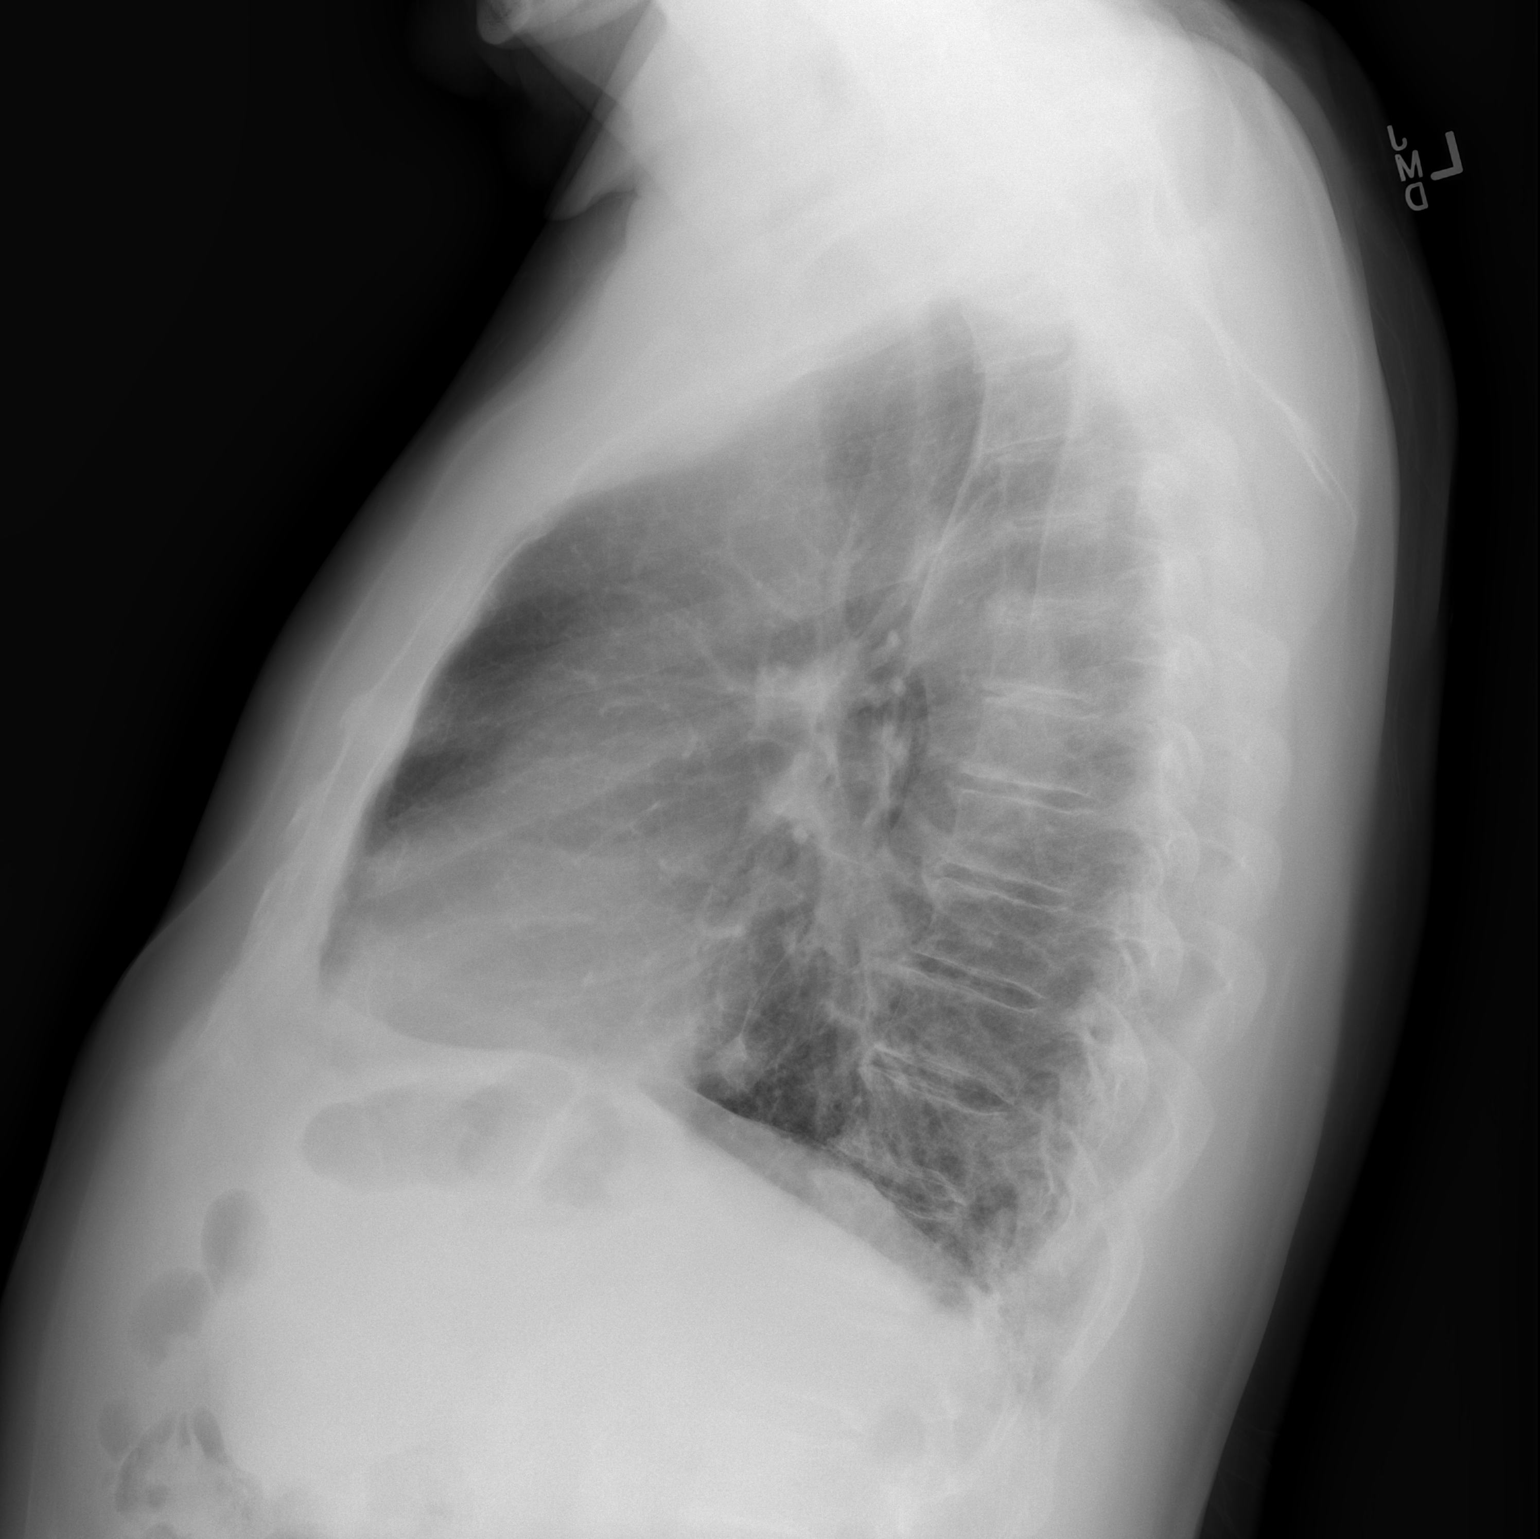

[2 of 2 positions shown; findings below may reference images not displayed]

FINDINGS: Normal heart size. Normal mediastinal contour. No pneumothorax. No
pleural effusion. No pulmonary edema. Minimal scarring versus
atelectasis at the left lung base. No acute consolidative airspace
disease.
IMPRESSION: Minimal left basilar scarring versus atelectasis. Otherwise no
active cardiopulmonary disease.

## 2019-12-04 ENCOUNTER — Other Ambulatory Visit: Payer: Self-pay

## 2019-12-04 ENCOUNTER — Ambulatory Visit (AMBULATORY_SURGERY_CENTER): Payer: Self-pay | Admitting: *Deleted

## 2019-12-04 VITALS — Ht 69.5 in | Wt 217.0 lb

## 2019-12-04 DIAGNOSIS — Z86010 Personal history of colonic polyps: Secondary | ICD-10-CM

## 2019-12-04 MED ORDER — SUTAB 1479-225-188 MG PO TABS: 1.0000 | ORAL_TABLET | ORAL | 0 refills | Status: DC

## 2019-12-04 NOTE — Progress Notes (Signed)
Patient is here in-person for PV. Patient denies any allergies to eggs or soy. Patient denies any problems with anesthesia/sedation. Patient denies any oxygen use at home. Patient denies taking any diet/weight loss medications or blood thinners. Patient is not being treated for MRSA or C-diff. Patient is aware of our care-partner policy and 0000000 safety protocol. EMMI education assisgned to the patient for the procedure, this was explained and instructions given to patient.   Completed covid vaccines x2 in Feb. 2021 per pt.  Prep Prescription coupon given to the patient.

## 2019-12-10 ENCOUNTER — Encounter: Payer: Self-pay | Admitting: Internal Medicine

## 2019-12-18 ENCOUNTER — Encounter: Payer: Self-pay | Admitting: Internal Medicine

## 2019-12-18 ENCOUNTER — Ambulatory Visit (AMBULATORY_SURGERY_CENTER): Payer: Medicare PPO | Admitting: Internal Medicine

## 2019-12-18 ENCOUNTER — Other Ambulatory Visit: Payer: Self-pay

## 2019-12-18 VITALS — BP 107/72 | HR 68 | Temp 97.5°F | Resp 13 | Ht 69.5 in | Wt 217.0 lb

## 2019-12-18 DIAGNOSIS — D122 Benign neoplasm of ascending colon: Secondary | ICD-10-CM | POA: Diagnosis not present

## 2019-12-18 DIAGNOSIS — Z86010 Personal history of colonic polyps: Secondary | ICD-10-CM

## 2019-12-18 DIAGNOSIS — Z1211 Encounter for screening for malignant neoplasm of colon: Secondary | ICD-10-CM | POA: Diagnosis not present

## 2019-12-18 MED ORDER — SODIUM CHLORIDE 0.9 % IV SOLN: 500.0000 mL | Freq: Once | INTRAVENOUS | Status: DC

## 2019-12-18 NOTE — Progress Notes (Signed)
A and O x3. Report to RN. Tolerated MAC anesthesia well.

## 2019-12-18 NOTE — Op Note (Signed)
[facility] Patient Name: Robert [name] Procedure Date: 12/18/2019 9:28 AM MRN: [id number] Endoscopist: Docia Chuck. Robert [name] , MD Age: 79 Referring MD:  Date of Birth: 02-21-1941 Gender: Male Account #: [id number] Procedure:                Colonoscopy with cold snare polypectomy x 1 Indications:              High risk colon cancer surveillance: Personal                            history of non-advanced adenoma. Previous                            examinations 2003, 2006, 2016 Medicines:                Monitored Anesthesia Care Procedure:                Pre-Anesthesia Assessment:                           - Prior to the procedure, a History and Physical                            was performed, and patient medications and                            allergies were reviewed. The patient's tolerance of                            previous anesthesia was also reviewed. The risks                            and benefits of the procedure and the sedation                            options and risks were discussed with the patient.                            All questions were answered, and informed consent                            was obtained. Prior Anticoagulants: The patient has                            taken no previous anticoagulant or antiplatelet                            agents. ASA Grade Assessment: II - A patient with                            mild systemic disease. After reviewing the risks                            and benefits, the patient was deemed in  satisfactory condition to undergo the procedure.                           After obtaining informed consent, the colonoscope                            was passed under direct vision. Throughout the                            procedure, the patient's blood pressure, pulse, and                            oxygen saturations were monitored continuously. The                            Colonoscope was  introduced through the anus and                            advanced to the the cecum, identified by                            appendiceal orifice and ileocecal valve. The                            ileocecal valve, appendiceal orifice, and rectum                            were photographed. The quality of the bowel                            preparation was excellent. The colonoscopy was                            performed without difficulty. The patient tolerated                            the procedure well. The bowel preparation used was                            SUPREP via split dose instruction. Scope In: 9:43:56 AM Scope Out: 10:01:19 AM Scope Withdrawal Time: 0 hours 8 minutes 56 seconds  Total Procedure Duration: 0 hours 17 minutes 23 seconds  Findings:                 A less than 1 mm polyp was found in the ascending                            colon. The polyp was removed with a cold snare.                            Resection and retrieval were complete.                           Multiple diverticula were found in the sigmoid  colon and ascending colon.                           Internal hemorrhoids as well as a hypertrophic anal                            papilla were found during retroflexion.                           The exam was otherwise without abnormality on                            direct and retroflexion views.                           NOTE: Patient has a clinically significant right                            inguinal hernia which allowed entry of the scope.                            Once identified, scope was retracted, hernia                            reduced, and exam completed. Complications:            No immediate complications. Estimated blood loss:                            None. Estimated Blood Loss:     Estimated blood loss: none. Impression:               - One less than 1 mm polyp in the ascending colon,                             removed with a cold snare. Resected and retrieved.                           - Diverticulosis in the sigmoid colon and in the                            ascending colon.                           - Internal hemorrhoids.                           - The examination was otherwise normal on direct                            and retroflexion views.                           - INGUINAL HERNIA Recommendation:           - Repeat colonoscopy is not recommended for  surveillance (findings and current age).                           - Patient has a contact number available for                            emergencies. The signs and symptoms of potential                            delayed complications were discussed with the                            patient. Return to normal activities tomorrow.                            Written discharge instructions were provided to the                            patient.                           - Resume previous diet.                           - Continue present medications.                           - Await pathology results.                           - Recommend general surgical evaluation of the                            inguinal hernia Robert [name] N. Robert Pastor, MD 12/18/2019 10:10:48 AM This report has been signed electronically.

## 2019-12-18 NOTE — Progress Notes (Signed)
Called to room to assist during endoscopic procedure.  Patient ID and intended procedure confirmed with present staff. Received instructions for my participation in the procedure from the performing physician.  

## 2019-12-18 NOTE — Patient Instructions (Signed)
Handouts on polyps,diverticulosis,& hemorrhoids given to you today  Await pathology results on polyp removed   Recommended by Dr Henrene Pastor that you see a surgeon concerning Inguinal Hernia noted today   YOU HAD AN ENDOSCOPIC PROCEDURE TODAY AT [facility]:   Refer to the procedure report that was given to you for any specific questions about what was found during the examination.  If the procedure report does not answer your questions, please call your gastroenterologist to clarify.  If you requested that your care partner not be given the details of your procedure findings, then the procedure report has been included in a sealed envelope for you to review at your convenience later.  YOU SHOULD EXPECT: Some feelings of bloating in the abdomen. Passage of more gas than usual.  Walking can help get rid of the air that was put into your GI tract during the procedure and reduce the bloating. If you had a lower endoscopy (such as a colonoscopy or flexible sigmoidoscopy) you may notice spotting of blood in your stool or on the toilet paper. If you underwent a bowel prep for your procedure, you may not have a normal bowel movement for a few days.  Please Note:  You might notice some irritation and congestion in your nose or some drainage.  This is from the oxygen used during your procedure.  There is no need for concern and it should clear up in a day or so.  SYMPTOMS TO REPORT IMMEDIATELY:   Following lower endoscopy (colonoscopy or flexible sigmoidoscopy):  Excessive amounts of blood in the stool  Significant tenderness or worsening of abdominal pains  Swelling of the abdomen that is new, acute  Fever of 100F or higher   For urgent or emergent issues, a gastroenterologist can be reached at any hour by calling [phone]. Do not use MyChart messaging for urgent concerns.    DIET:  We do recommend a small meal at first, but then you may proceed to your regular diet.  Drink  plenty of fluids but you should avoid alcoholic beverages for 24 hours.  ACTIVITY:  You should plan to take it easy for the rest of today and you should NOT DRIVE or use heavy machinery until tomorrow (because of the sedation medicines used during the test).    FOLLOW UP: Our staff will call the number listed on your records 48-72 hours following your procedure to check on you and address any questions or concerns that you may have regarding the information given to you following your procedure. If we do not reach you, we will leave a message.  We will attempt to reach you two times.  During this call, we will ask if you have developed any symptoms of COVID 19. If you develop any symptoms (ie: fever, flu-like symptoms, shortness of breath, cough etc.) before then, please call [phone].  If you test positive for Covid 19 in the 2 weeks post procedure, please call and report this information to [place].    If any biopsies were taken you will be contacted by phone or by letter within the next 1-3 weeks.  Please call us at [phone] if you have not heard about the biopsies in 3 weeks.    SIGNATURES/CONFIDENTIALITY: You and/or your care partner have signed paperwork which will be entered into your electronic medical record.  These signatures attest to the fact that that the information above on your After Visit Summary has been reviewed and is understood.  Full responsibility of the confidentiality of this discharge information lies with you and/or your care-partner.

## 2019-12-18 NOTE — Progress Notes (Signed)
Pt's states no medical or surgical changes since previsit or office visit. 

## 2019-12-22 ENCOUNTER — Telehealth: Payer: Self-pay

## 2019-12-22 ENCOUNTER — Encounter: Payer: Self-pay | Admitting: Internal Medicine

## 2019-12-22 NOTE — Telephone Encounter (Signed)
  Follow up Call-  Call back number 12/18/2019  Post procedure Call Back phone  # [phone]  Permission to leave phone message Yes  Some recent data might be hidden     Number was busy

## 2019-12-22 NOTE — Telephone Encounter (Signed)
Covid-19 screening questions   Do you now or have you had a fever in the last 14 days? No.  Do you have any respiratory symptoms of shortness of breath or cough now or in the last 14 days? No.  Do you have any family members or close contacts with diagnosed or suspected Covid-19 in the past 14 days? No.  Have you been tested for Covid-19 and found to be positive? No.       Follow up Call-  Call back number 12/18/2019  Post procedure Call Back phone  # [phone]  Permission to leave phone message Yes  Some recent data might be hidden     Patient questions:  Do you have a fever, pain , or abdominal swelling? No. Pain Score  0 *  Have you tolerated food without any problems? Yes.    Have you been able to return to your normal activities? Yes.    Do you have any questions about your discharge instructions: Diet   No. Medications  No. Follow up visit  No.  Do you have questions or concerns about your Care? No.  Actions: * If pain score is 4 or above: No action needed, pain <4.

## 2019-12-31 ENCOUNTER — Telehealth: Payer: Self-pay | Admitting: Internal Medicine

## 2019-12-31 NOTE — Telephone Encounter (Signed)
Pt states his PCP told him Dr. Henrene Pastor needed to make the referral to [facility] for inguinal hernia repair. Referral faxed to [facility] and pt knows if he does not hear anything in a few days to contact their office.

## 2020-01-28 DIAGNOSIS — K4090 Unilateral inguinal hernia, without obstruction or gangrene, not specified as recurrent: Secondary | ICD-10-CM | POA: Diagnosis not present

## 2020-03-01 ENCOUNTER — Ambulatory Visit: Payer: Self-pay | Admitting: General Surgery

## 2020-03-01 DIAGNOSIS — E669 Obesity, unspecified: Secondary | ICD-10-CM | POA: Diagnosis not present

## 2020-03-01 DIAGNOSIS — R7301 Impaired fasting glucose: Secondary | ICD-10-CM | POA: Diagnosis not present

## 2020-03-01 DIAGNOSIS — I10 Essential (primary) hypertension: Secondary | ICD-10-CM | POA: Diagnosis not present

## 2020-03-01 DIAGNOSIS — Z8546 Personal history of malignant neoplasm of prostate: Secondary | ICD-10-CM | POA: Diagnosis not present

## 2020-03-01 DIAGNOSIS — E785 Hyperlipidemia, unspecified: Secondary | ICD-10-CM | POA: Diagnosis not present

## 2020-03-16 ENCOUNTER — Encounter (HOSPITAL_BASED_OUTPATIENT_CLINIC_OR_DEPARTMENT_OTHER): Payer: Self-pay | Admitting: General Surgery

## 2020-03-16 ENCOUNTER — Other Ambulatory Visit (HOSPITAL_COMMUNITY)
Admission: RE | Admit: 2020-03-16 | Discharge: 2020-03-16 | Disposition: A | Discharge status: Home / Self Care | Payer: Medicare PPO | Source: Ambulatory Visit | Attending: General Surgery | Admitting: General Surgery

## 2020-03-16 ENCOUNTER — Other Ambulatory Visit: Payer: Self-pay

## 2020-03-16 DIAGNOSIS — Z01812 Encounter for preprocedural laboratory examination: Secondary | ICD-10-CM | POA: Insufficient documentation

## 2020-03-16 DIAGNOSIS — Z20822 Contact with and (suspected) exposure to covid-19: Secondary | ICD-10-CM | POA: Insufficient documentation

## 2020-03-16 LAB — SARS CORONAVIRUS 2 (TAT 6-24 HRS): SARS Coronavirus 2: NEGATIVE

## 2020-03-16 NOTE — Progress Notes (Signed)
Spoke w/ via phone for pre-op interview--- PT Lab needs dos---- Robert [name]              Lab results------ last ekg 01-04-2018 in epic/ chart COVID test ------ 03-16-2020 @ 1005 Arrive at ------- 0730 NPO after MN  Medications to take morning of surgery ----- NONE Diabetic medication ----- n/a Patient Special Instructions ----- n/a Pre-Op special Istructions ----- n/a Patient verbalized understanding of instructions that were given at this phone interview. Patient denies shortness of breath, chest pain, fever, cough at this phone interview.

## 2020-03-18 NOTE — Anesthesia Preprocedure Evaluation (Addendum)
Anesthesia Evaluation  Patient identified by MRN, date of birth, ID band Patient awake    Reviewed: Allergy & Precautions, NPO status , Patient's Chart, lab work & pertinent test results  Airway Mallampati: II  TM Distance: >3 FB Neck ROM: Full    Dental no notable dental hx. (+) Teeth Intact, Dental Advisory Given   Pulmonary neg pulmonary ROS,    Pulmonary exam normal breath sounds clear to auscultation       Cardiovascular hypertension, Pt. on medications Normal cardiovascular exam Rhythm:Regular Rate:Normal     Neuro/Psych negative neurological ROS  negative psych ROS   GI/Hepatic negative GI ROS, Neg liver ROS,   Endo/Other  negative endocrine ROS  Renal/GU negative Renal ROS     Musculoskeletal negative musculoskeletal ROS (+)   Abdominal (+) + obese,   Peds  Hematology negative hematology ROS (+)   Anesthesia Other Findings   Reproductive/Obstetrics                            Anesthesia Physical Anesthesia Plan  ASA: II  Anesthesia Plan: General   Post-op Pain Management:    Induction: Intravenous  PONV Risk Score and Plan: 3 and Treatment may vary due to age or medical condition, Ondansetron and Dexamethasone  Airway Management Planned: LMA  Additional Equipment: None  Intra-op Plan:   Post-operative Plan:   Informed Consent: I have reviewed the patients History and Physical, chart, labs and discussed the procedure including the risks, benefits and alternatives for the proposed anesthesia with the patient or authorized representative who has indicated his/her understanding and acceptance.     Dental advisory given  Plan Discussed with: CRNA and Anesthesiologist  Anesthesia Plan Comments: (LMA w L TAP block)       Anesthesia Quick Evaluation

## 2020-03-19 ENCOUNTER — Encounter (HOSPITAL_BASED_OUTPATIENT_CLINIC_OR_DEPARTMENT_OTHER)
Admission: RE | Disposition: A | Discharge status: Home / Self Care | Payer: Self-pay | Source: Home / Self Care | Attending: General Surgery

## 2020-03-19 ENCOUNTER — Other Ambulatory Visit: Payer: Self-pay

## 2020-03-19 ENCOUNTER — Ambulatory Visit (HOSPITAL_BASED_OUTPATIENT_CLINIC_OR_DEPARTMENT_OTHER): Payer: Medicare PPO | Admitting: Anesthesiology

## 2020-03-19 ENCOUNTER — Encounter (HOSPITAL_BASED_OUTPATIENT_CLINIC_OR_DEPARTMENT_OTHER): Payer: Self-pay | Admitting: General Surgery

## 2020-03-19 ENCOUNTER — Ambulatory Visit (HOSPITAL_BASED_OUTPATIENT_CLINIC_OR_DEPARTMENT_OTHER)
Admission: RE | Admit: 2020-03-19 | Discharge: 2020-03-19 | Disposition: A | Discharge status: Home / Self Care | Payer: Medicare PPO | Attending: General Surgery | Admitting: General Surgery

## 2020-03-19 DIAGNOSIS — I10 Essential (primary) hypertension: Secondary | ICD-10-CM | POA: Insufficient documentation

## 2020-03-19 DIAGNOSIS — Z8546 Personal history of malignant neoplasm of prostate: Secondary | ICD-10-CM | POA: Diagnosis not present

## 2020-03-19 DIAGNOSIS — E785 Hyperlipidemia, unspecified: Secondary | ICD-10-CM | POA: Insufficient documentation

## 2020-03-19 DIAGNOSIS — Z7982 Long term (current) use of aspirin: Secondary | ICD-10-CM | POA: Diagnosis not present

## 2020-03-19 DIAGNOSIS — Z79899 Other long term (current) drug therapy: Secondary | ICD-10-CM | POA: Diagnosis not present

## 2020-03-19 DIAGNOSIS — G8918 Other acute postprocedural pain: Secondary | ICD-10-CM | POA: Diagnosis not present

## 2020-03-19 DIAGNOSIS — K4090 Unilateral inguinal hernia, without obstruction or gangrene, not specified as recurrent: Secondary | ICD-10-CM | POA: Diagnosis not present

## 2020-03-19 HISTORY — DX: Unilateral inguinal hernia, without obstruction or gangrene, not specified as recurrent: K40.90

## 2020-03-19 HISTORY — PX: INSERTION OF MESH: SHX5868

## 2020-03-19 HISTORY — PX: INGUINAL HERNIA REPAIR: SHX194

## 2020-03-19 HISTORY — DX: Personal history of colonic polyps: Z86.010

## 2020-03-19 LAB — POCT I-STAT, CHEM 8
BUN: 21 mg/dL (ref 8–23)
Calcium, Ion: 1.15 mmol/L (ref 1.15–1.40)
Chloride: 102 mmol/L (ref 98–111)
Creatinine, Ser: 1 mg/dL (ref 0.61–1.24)
Glucose, Bld: 113 mg/dL — ABNORMAL HIGH (ref 70–99)
HCT: 45 % (ref 39.0–52.0)
Hemoglobin: 15.3 g/dL (ref 13.0–17.0)
Potassium: 4 mmol/L (ref 3.5–5.1)
Sodium: 141 mmol/L (ref 135–145)
TCO2: 24 mmol/L (ref 22–32)

## 2020-03-19 SURGERY — REPAIR, HERNIA, INGUINAL, ADULT
Anesthesia: General | Site: Groin | Laterality: Left

## 2020-03-19 MED ORDER — FENTANYL CITRATE (PF) 250 MCG/5ML IJ SOLN
INTRAMUSCULAR | Status: AC
  Filled 2020-03-19: qty 5

## 2020-03-19 MED ORDER — PROPOFOL 10 MG/ML IV BOLUS
INTRAVENOUS | Status: AC
  Filled 2020-03-19: qty 20

## 2020-03-19 MED ORDER — FENTANYL CITRATE (PF) 100 MCG/2ML IJ SOLN: 25.0000 ug | INTRAMUSCULAR | Status: DC | PRN

## 2020-03-19 MED ORDER — ROPIVACAINE HCL 5 MG/ML IJ SOLN
INTRAMUSCULAR | Status: DC | PRN
  Administered 2020-03-19: 30 mL

## 2020-03-19 MED ORDER — PHENYLEPHRINE HCL (PRESSORS) 10 MG/ML IV SOLN
INTRAVENOUS | Status: DC | PRN
  Administered 2020-03-19: 40 ug via INTRAVENOUS

## 2020-03-19 MED ORDER — ACETAMINOPHEN 10 MG/ML IV SOLN: 1000.0000 mg | Freq: Once | INTRAVENOUS | Status: DC | PRN

## 2020-03-19 MED ORDER — CHLORHEXIDINE GLUCONATE CLOTH 2 % EX PADS: 6.0000 | MEDICATED_PAD | Freq: Once | CUTANEOUS | Status: DC

## 2020-03-19 MED ORDER — BUPIVACAINE HCL (PF) 0.5 % IJ SOLN
INTRAMUSCULAR | Status: DC | PRN
  Administered 2020-03-19: 8 mL

## 2020-03-19 MED ORDER — DEXAMETHASONE SODIUM PHOSPHATE 10 MG/ML IJ SOLN
INTRAMUSCULAR | Status: AC
  Filled 2020-03-19: qty 1

## 2020-03-19 MED ORDER — 0.9 % SODIUM CHLORIDE (POUR BTL) OPTIME
TOPICAL | Status: DC | PRN
  Administered 2020-03-19: 500 mL

## 2020-03-19 MED ORDER — LACTATED RINGERS IV SOLN
INTRAVENOUS | Status: DC
  Administered 2020-03-19: 50 mL via INTRAVENOUS

## 2020-03-19 MED ORDER — ONDANSETRON HCL 4 MG/2ML IJ SOLN
INTRAMUSCULAR | Status: AC
  Filled 2020-03-19: qty 2

## 2020-03-19 MED ORDER — FENTANYL CITRATE (PF) 100 MCG/2ML IJ SOLN
INTRAMUSCULAR | Status: AC
  Filled 2020-03-19: qty 2

## 2020-03-19 MED ORDER — FENTANYL CITRATE (PF) 100 MCG/2ML IJ SOLN
50.0000 ug | Freq: Once | INTRAMUSCULAR | Status: AC
  Administered 2020-03-19: 50 ug via INTRAVENOUS

## 2020-03-19 MED ORDER — CLONIDINE HCL (ANALGESIA) 100 MCG/ML EP SOLN
EPIDURAL | Status: DC | PRN
  Administered 2020-03-19: 100 ug

## 2020-03-19 MED ORDER — EPHEDRINE SULFATE 50 MG/ML IJ SOLN
INTRAMUSCULAR | Status: DC | PRN
  Administered 2020-03-19 (×2): 5 mg via INTRAVENOUS

## 2020-03-19 MED ORDER — ACETAMINOPHEN 500 MG PO TABS
ORAL_TABLET | ORAL | Status: AC
  Filled 2020-03-19: qty 2

## 2020-03-19 MED ORDER — ACETAMINOPHEN 500 MG PO TABS
1000.0000 mg | ORAL_TABLET | ORAL | Status: AC
  Administered 2020-03-19: 1000 mg via ORAL

## 2020-03-19 MED ORDER — CEFAZOLIN SODIUM-DEXTROSE 2-4 GM/100ML-% IV SOLN
2.0000 g | INTRAVENOUS | Status: AC
  Administered 2020-03-19: 2 g via INTRAVENOUS

## 2020-03-19 MED ORDER — ONDANSETRON HCL 4 MG/2ML IJ SOLN
INTRAMUSCULAR | Status: DC | PRN
  Administered 2020-03-19: 4 mg via INTRAVENOUS

## 2020-03-19 MED ORDER — ROCURONIUM BROMIDE 10 MG/ML (PF) SYRINGE
PREFILLED_SYRINGE | INTRAVENOUS | Status: AC
  Filled 2020-03-19: qty 10

## 2020-03-19 MED ORDER — ENSURE PRE-SURGERY PO LIQD: 296.0000 mL | Freq: Once | ORAL | Status: DC

## 2020-03-19 MED ORDER — ONDANSETRON HCL 4 MG/2ML IJ SOLN: 4.0000 mg | Freq: Once | INTRAMUSCULAR | Status: DC | PRN

## 2020-03-19 MED ORDER — LIDOCAINE HCL (CARDIAC) PF 100 MG/5ML IV SOSY
PREFILLED_SYRINGE | INTRAVENOUS | Status: DC | PRN
  Administered 2020-03-19: 90 mg via INTRAVENOUS

## 2020-03-19 MED ORDER — PROPOFOL 10 MG/ML IV BOLUS
INTRAVENOUS | Status: DC | PRN
  Administered 2020-03-19: 180 mg via INTRAVENOUS

## 2020-03-19 MED ORDER — CEFAZOLIN SODIUM-DEXTROSE 2-4 GM/100ML-% IV SOLN
INTRAVENOUS | Status: AC
  Filled 2020-03-19: qty 100

## 2020-03-19 MED ORDER — DEXAMETHASONE SODIUM PHOSPHATE 10 MG/ML IJ SOLN
INTRAMUSCULAR | Status: DC | PRN
  Administered 2020-03-19: 4 mg via INTRAVENOUS

## 2020-03-19 MED ORDER — LIDOCAINE 2% (20 MG/ML) 5 ML SYRINGE
INTRAMUSCULAR | Status: AC
  Filled 2020-03-19: qty 5

## 2020-03-19 MED ORDER — OXYCODONE HCL 5 MG PO TABS: 5.0000 mg | ORAL_TABLET | Freq: Four times a day (QID) | ORAL | 0 refills | Status: DC | PRN

## 2020-03-19 MED ORDER — FENTANYL CITRATE (PF) 100 MCG/2ML IJ SOLN
INTRAMUSCULAR | Status: DC | PRN
Start: 2020-03-19 — End: 2020-03-19
  Administered 2020-03-19: 50 ug via INTRAVENOUS
  Administered 2020-03-19 (×5): 25 ug via INTRAVENOUS
  Administered 2020-03-19: 50 ug via INTRAVENOUS
  Administered 2020-03-19: 25 ug via INTRAVENOUS

## 2020-03-19 SURGICAL SUPPLY — 54 items
ADH SKN CLS APL DERMABOND .7 (GAUZE/BANDAGES/DRESSINGS) ×1
APL PRP STRL LF DISP 70% ISPRP (MISCELLANEOUS) ×1
BLADE CLIPPER SENSICLIP SURGIC (BLADE) ×3 IMPLANT
BLADE HEX COATED 2.75 (ELECTRODE) ×3 IMPLANT
BLADE SURG 15 STRL LF DISP TIS (BLADE) ×1 IMPLANT
BLADE SURG 15 STRL SS (BLADE) ×3
CANISTER SUCT 3000ML PPV (MISCELLANEOUS) IMPLANT
CELLS DAT CNTRL 66122 CELL SVR (MISCELLANEOUS) IMPLANT
CHLORAPREP W/TINT 26 (MISCELLANEOUS) ×3 IMPLANT
COVER BACK TABLE 60X90IN (DRAPES) ×3 IMPLANT
COVER MAYO STAND STRL (DRAPES) ×3 IMPLANT
COVER WAND RF STERILE (DRAPES) ×3 IMPLANT
DERMABOND ADVANCED (GAUZE/BANDAGES/DRESSINGS) ×2
DERMABOND ADVANCED .7 DNX12 (GAUZE/BANDAGES/DRESSINGS) ×1 IMPLANT
DRAIN PENROSE 0.25X18 (DRAIN) IMPLANT
DRAPE LAPAROSCOPIC ABDOMINAL (DRAPES) ×3 IMPLANT
DRAPE UTILITY XL STRL (DRAPES) ×3 IMPLANT
ELECT REM PT RETURN 9FT ADLT (ELECTROSURGICAL) ×3
ELECTRODE REM PT RTRN 9FT ADLT (ELECTROSURGICAL) ×1 IMPLANT
GLOVE BIOGEL PI IND STRL 7.0 (GLOVE) ×1 IMPLANT
GLOVE BIOGEL PI INDICATOR 7.0 (GLOVE) ×2
GLOVE SURG SS PI 7.0 STRL IVOR (GLOVE) ×3 IMPLANT
GOWN STRL REUS W/ TWL LRG LVL3 (GOWN DISPOSABLE) ×1 IMPLANT
GOWN STRL REUS W/TWL LRG LVL3 (GOWN DISPOSABLE) ×3
KIT TURNOVER CYSTO (KITS) ×3 IMPLANT
MESH HERNIA 3X6 (Mesh General) ×2 IMPLANT
NDL HYPO 25X1 1.5 SAFETY (NEEDLE) ×1 IMPLANT
NEEDLE HYPO 25X1 1.5 SAFETY (NEEDLE) ×3 IMPLANT
NS IRRIG 500ML POUR BTL (IV SOLUTION) ×3 IMPLANT
PACK BASIN DAY SURGERY FS (CUSTOM PROCEDURE TRAY) ×3 IMPLANT
PAD ARMBOARD 7.5X6 YLW CONV (MISCELLANEOUS) ×3 IMPLANT
PENCIL SMOKE EVACUATOR (MISCELLANEOUS) ×3 IMPLANT
RETRACTOR WND ALEXIS 18 MED (MISCELLANEOUS) IMPLANT
RTRCTR WOUND ALEXIS 18CM MED (MISCELLANEOUS)
SPONGE LAP 4X18 RFD (DISPOSABLE) IMPLANT
SUT MNCRL AB 4-0 PS2 18 (SUTURE) ×3 IMPLANT
SUT PDS AB 0 CT1 36 (SUTURE) IMPLANT
SUT PDS AB 2-0 CT2 27 (SUTURE) IMPLANT
SUT PROLENE 2 0 CT2 30 (SUTURE) ×8 IMPLANT
SUT SILK 3 0 TIES 17X18 (SUTURE) ×3
SUT SILK 3-0 18XBRD TIE BLK (SUTURE) IMPLANT
SUT VIC AB 2-0 SH 18 (SUTURE) ×3 IMPLANT
SUT VIC AB 2-0 SH 27 (SUTURE) ×3
SUT VIC AB 2-0 SH 27XBRD (SUTURE) ×1 IMPLANT
SUT VIC AB 3-0 SH 27 (SUTURE) ×3
SUT VIC AB 3-0 SH 27X BRD (SUTURE) ×1 IMPLANT
SUT VICRYL 2 0 18  UND BR (SUTURE) ×3
SUT VICRYL 2 0 18 UND BR (SUTURE) ×1 IMPLANT
SYR BULB IRRIG 60ML STRL (SYRINGE) ×3 IMPLANT
SYR CONTROL 10ML LL (SYRINGE) ×3 IMPLANT
TOWEL OR 17X26 10 PK STRL BLUE (TOWEL DISPOSABLE) ×3 IMPLANT
TUBE CONNECTING 12'X1/4 (SUCTIONS) ×1
TUBE CONNECTING 12X1/4 (SUCTIONS) ×2 IMPLANT
YANKAUER SUCT BULB TIP NO VENT (SUCTIONS) ×3 IMPLANT

## 2020-03-19 NOTE — Anesthesia Postprocedure Evaluation (Signed)
Anesthesia Post Note  Patient: Robert [name]  Procedure(s) Performed: LEFT INGUINAL HERNIA REPAIR (Left Groin) INSERTION OF MESH (Left Groin)     Patient location during evaluation: PACU Anesthesia Type: General Level of consciousness: awake and alert Pain management: pain level controlled Vital Signs Assessment: post-procedure vital signs reviewed and stable Respiratory status: spontaneous breathing, nonlabored ventilation, respiratory function stable and patient connected to nasal cannula oxygen Cardiovascular status: blood pressure returned to baseline and stable Postop Assessment: no apparent nausea or vomiting Anesthetic complications: no   No complications documented.  Last Vitals:  Vitals:   03/19/20 1145 03/19/20 1200  BP: 116/84   Pulse: 81   Resp: 17   Temp:  36.6 C  SpO2: 97%     Last Pain:  Vitals:   03/19/20 1200  TempSrc:   PainSc: 0-No pain                 Barnet Glasgow

## 2020-03-19 NOTE — Discharge Instructions (Signed)
[facility]  UMBILICAL OR INGUINAL HERNIA REPAIR: POST OP INSTRUCTIONS  Always review your discharge instruction sheet given to you by the facility where your surgery was performed. IF YOU HAVE DISABILITY OR FAMILY LEAVE FORMS, YOU MUST BRING THEM TO THE OFFICE FOR PROCESSING.   DO NOT GIVE THEM TO YOUR DOCTOR.  1. A  prescription for pain medication may be given to you upon discharge.  Take your pain medication as prescribed, if needed.  If narcotic pain medicine is not needed, then you may take acetaminophen (Tylenol) or ibuprofen (Advil) as needed. 2. Take your usually prescribed medications unless otherwise directed. If you need a refill on your pain medication, please contact your pharmacy.  They will contact our office to request authorization. Prescriptions will not be filled after 5 pm or on week-ends. 3. You should follow a light diet the first 24 hours after arrival home, such as soup and crackers, etc.  Be sure to include lots of fluids daily.  Resume your normal diet the day after surgery. 4.Most patients will experience some swelling and bruising around the umbilicus or in the groin and scrotum.  Ice packs and reclining will help.  Swelling and bruising can take several days to resolve.  6. It is common to experience some constipation if taking pain medication after surgery.  Increasing fluid intake and taking a stool softener (such as Colace) will usually help or prevent this problem from occurring.  A mild laxative (Milk of Magnesia or Miralax) should be taken according to package directions if there are no bowel movements after 48 hours. 7. Unless discharge instructions indicate otherwise, you may remove your bandages 24-48 hours after surgery, and you may shower at that time.  You may have steri-strips (small skin tapes) in place directly over the incision.  These strips should be left on the skin for 7-10 days.  If your surgeon used skin glue on the  incision, you may shower in 24 hours.  The glue will flake off over the next 2-3 weeks.  Any sutures or staples will be removed at the office during your follow-up visit. 8. ACTIVITIES:  You may resume regular (light) daily activities beginning the next day--such as daily self-care, walking, climbing stairs--gradually increasing activities as tolerated.  You may have sexual intercourse when it is comfortable.  Refrain from any heavy lifting or straining until approved by your doctor.  a.You may drive when you are no longer taking prescription pain medication, you can comfortably wear a seatbelt, and you can safely maneuver your car and apply brakes. b.RETURN TO WORK:   _____________________________________________  9.You should see your doctor in the office for a follow-up appointment approximately 2-3 weeks after your surgery.  Make sure that you call for this appointment within a day or two after you arrive home to insure a convenient appointment time. 10.OTHER INSTRUCTIONS: _________________________    _____________________________________  WHEN TO CALL YOUR DOCTOR: 1. Fever over 101.0 2. Inability to urinate 3. Nausea and/or vomiting 4. Extreme swelling or bruising 5. Continued bleeding from incision. 6. Increased pain, redness, or drainage from the incision  The clinic staff is available to answer your questions during regular business hours.  Please don't hesitate to call and ask to speak to one of the nurses for clinical concerns.  If you have a medical emergency, go to the nearest emergency room or call 911.  A surgeon from [facility] is always on call at the hospital     [address] [place], [place], [place]  [address] ?  [address], [place], [place]   [phone] ? [phone] ? FAX [phone] Web site: [email or web address]   Post Anesthesia Home Care Instructions  Activity: Get plenty of rest for the remainder of the day. A  responsible individual must stay with you for 24 hours following the procedure.  For the next 24 hours, DO NOT: -Drive a car -Paediatric nurse -Drink alcoholic beverages -Take any medication unless instructed by your physician -Make any legal decisions or sign important papers.  Meals: Start with liquid foods such as gelatin or soup. Progress to regular foods as tolerated. Avoid greasy, spicy, heavy foods. If nausea and/or vomiting occur, drink only clear liquids until the nausea and/or vomiting subsides. Call your physician if vomiting continues.  Special Instructions/Symptoms: Your throat may feel dry or sore from the anesthesia or the breathing tube placed in your throat during surgery. If this causes discomfort, gargle with warm salt water. The discomfort should disappear within 24 hours.

## 2020-03-19 NOTE — Anesthesia Procedure Notes (Addendum)
Anesthesia Regional Block: TAP block   Pre-Anesthetic Checklist: ,, timeout performed, Correct Patient, Correct Site, Correct Laterality, Correct Procedure, Correct Position, site marked, Risks and benefits discussed, Surgical consent, post-op pain management  Laterality: Left  Prep: Maximum Sterile Barrier Precautions used, Dura Prep       Needles:  Injection technique: Single-shot  Needle Type: Echogenic Needle     Needle Length: 9cm  Needle Gauge: 21     Additional Needles:   Procedures:,,,, ultrasound used (permanent image in chart),,,,  Narrative:  Start time: 03/19/2020 8:38 AM End time: 03/19/2020 8:46 AM  Performed by: Personally  Anesthesiologist: Barnet Glasgow, MD  Additional Notes: Pt tolerated procedure well

## 2020-03-19 NOTE — Transfer of Care (Signed)
Immediate Anesthesia Transfer of Care Note  Patient: Robert [name]  Procedure(s) Performed: LEFT INGUINAL HERNIA REPAIR (Left Groin) INSERTION OF MESH (Left Groin)  Patient Location: PACU  Anesthesia Type:General  Level of Consciousness: awake, oriented, drowsy and patient cooperative  Airway & Oxygen Therapy: Patient Spontanous Breathing and Patient connected to face mask oxygen  Post-op Assessment: Report given to RN and Post -op Vital signs reviewed and stable  Post vital signs: Reviewed and stable  Last Vitals:  Vitals Value Taken Time  BP 112/81 03/19/20 1118  Temp 36.6 C 03/19/20 1118  Pulse 89 03/19/20 1121  Resp 19 03/19/20 1121  SpO2 97 % 03/19/20 1121  Vitals shown include unvalidated device data.  Last Pain:  Vitals:   03/19/20 0915  TempSrc:   PainSc: 0-No pain      Patients Stated Pain Goal: 5 (87/21/58 7276)  Complications: No complications documented.

## 2020-03-19 NOTE — Op Note (Signed)
Preop diagnosis: Left inguinal hernia  Postop diagnosis: Left inguinal hernia  Procedure: Open left inguinal hernia repair with mesh  Surgeon: Gurney Maxin, M.D.  Asst: Daiva Huge, MD (PGY-7)  Anesthesia: LMA   Description of procedure: The patient was brought into the operative suite, placed supine. Anesthesia was administered with endotracheal tube. Patient was strapped in place. The patient was prepped and draped in the usual sterile fashion.  The anterior superior iliac spine and pubic tubercle were identified on the Left side. An incision was made 1cm above the connecting line, representative of the location of the inguinal ligament. The subcutaneous tissue was bluntly dissected, scarpa's fascia was dissected away. The external abdominal oblique fascia was identified and sharply opened down to the external inguinal ring. The conjoint tendon and inguinal ligament were identified. The cord structures and sac were dissected free of the surrounding tissue in 360 degrees. A penrose drain was used to encircle the contents. The cremasteric fibers were dissected free of the contents of the cord and hernia sac. The cord structures (vessels and vas deferens) were identified and carefully dissected away from the hernia sac.  The hernia sac was dissected down to the internal inguinal ring. Preperitoneal fat was identified showing appropriate dissection. The sac was then reduced into the preperitoneal space. A 3x6 Bard Soft mesh was then used to close the defect and reinforce the floor. The mesh was sutured to the lacunar ligament and inguinal ligament using a 2-0 prolene in running fashion. Next the superior edge of the mesh was sutured to the conjoined tendon using a 2-0 running Prolene. An additional 2-0 Prolene was used to suture the tail ends of the mesh together re-creating the deep ring. Cord structures are running in a neutral position through the mesh. Next the external abdominal oblique fascia  was closed with a 2-0 Vicryl in running fashion to re-create the external inguinal ring. Scarpa's fascia was closed with 3-0 Vicryl in running fashion. Skin was closed with a 4-0 Monocryl subcuticular stitch in running fashion. Dermabond place for dressing. Patient woke from anesthesia and brought to PACU in stable condition. All counts are correct.  Findings: Left indirect inguinal hernia  Specimen: none  Blood loss: Minimal  Local anesthesia: 10 ml Exparel: Marcaine mix  Complications: none  Implant: Bard soft mesh

## 2020-03-19 NOTE — H&P (Signed)
Robert [name] is an 79 y.o. male.   Chief Complaint: inguinal hernia HPI: 79 yo male with symptomatic left inguinal hernia. He denies nausea or vomiting. The hernia continues to have minor pains and aches. He presents for repair.  Past Medical History:  Diagnosis Date  . Diverticulosis, sigmoid 08/12/2014   Moderate, noted on colonoscopy  . History of adenomatous polyp of colon   . Hyperlipidemia   . Hypertension    followed by pcp   (03-16-2020  per pt had stress test several years ago, told ok)  . Left inguinal hernia   . Prostate cancer [facility]) urologist-- dr Robert [name]   dx 2019,  Stage T1c, Gleason 3+4;  s/p  brachytherapy 03-08-2018  . Wears glasses     Past Surgical History:  Procedure Laterality Date  . COLONOSCOPY W/ POLYPECTOMY  last one 12-18-2019  dr Henrene Pastor  . CYSTOSCOPY N/A 03/08/2018   Procedure: CYSTOSCOPY;  Surgeon: Festus Aloe, MD;  Location: [facility];  Service: Urology;  Laterality: N/A;  no seeds in bladder per dr Robert [name]  . PROSTATE BIOPSY  10/16/2017  . RADIOACTIVE SEED IMPLANT N/A 03/08/2018   Procedure: RADIOACTIVE SEED IMPLANT/BRACHYTHERAPY IMPLANT;  Surgeon: Festus Aloe, MD;  Location: [facility];  Service: Urology;  Laterality: N/A;  86 seeds  . SPACE OAR INSTILLATION N/A 03/08/2018   Procedure: SPACE OAR INSTILLATION;  Surgeon: Festus Aloe, MD;  Location: [facility];  Service: Urology;  Laterality: N/A;  . TONSILLECTOMY  child    Family History  Problem Relation Age of Onset  . Cancer Paternal Grandfather        unknown/bone  . Colon cancer Neg Hx   . Colon polyps Neg Hx   . Esophageal cancer Neg Hx   . Rectal cancer Neg Hx   . Stomach cancer Neg Hx    Social History:  reports that he has never smoked. He has never used smokeless tobacco. He reports previous alcohol use. He reports that he does not use drugs.  Allergies: No Known Allergies  Medications Prior to Admission  Medication  Sig Dispense Refill  . aspirin 81 MG tablet Take 81 mg by mouth daily.    Marland Kitchen losartan-hydrochlorothiazide (HYZAAR) 100-12.5 MG tablet Take 1 tablet by mouth daily.     . Multiple Vitamin (MULTIVITAMIN) tablet Take 1 tablet by mouth daily.    . Omega-3 Fatty Acids (FISH OIL PO) Take 1,200 mg by mouth daily.    . simvastatin (ZOCOR) 40 MG tablet Take 40 mg by mouth every evening.       Results for orders placed or performed during the hospital encounter of 03/19/20 (from the past 48 hour(s))  I-STAT, chem 8     Status: Abnormal   Collection Time: 03/19/20  8:17 AM  Result Value Ref Range   Sodium 141 135 - 145 mmol/L   Potassium 4.0 3.5 - 5.1 mmol/L   Chloride 102 98 - 111 mmol/L   BUN 21 8 - 23 mg/dL   Creatinine, Ser 1.00 0.61 - 1.24 mg/dL   Glucose, Bld 113 (H) 70 - 99 mg/dL    Comment: Glucose reference range applies only to samples taken after fasting for at least 8 hours.   Calcium, Ion 1.15 1.15 - 1.40 mmol/L   TCO2 24 22 - 32 mmol/L   Hemoglobin 15.3 13.0 - 17.0 g/dL   HCT 45.0 39 - 52 %   No results found.  Review of Systems  Constitutional: Negative for  chills and fever.  HENT: Negative for hearing loss.   Respiratory: Negative for cough.   Cardiovascular: Negative for chest pain and palpitations.  Gastrointestinal: Negative for abdominal pain, nausea and vomiting.  Genitourinary: Negative for dysuria and urgency.  Musculoskeletal: Negative for myalgias and neck pain.  Skin: Negative for rash.  Neurological: Negative for dizziness and headaches.  Hematological: Does not bruise/bleed easily.  Psychiatric/Behavioral: Negative for suicidal ideas.    Blood pressure 121/81, pulse 72, temperature 97.9 F (36.6 C), temperature source Oral, resp. rate 16, height 5' 9.5" (1.765 m), weight 97.9 kg, SpO2 100 %. Physical Exam Vitals reviewed.  Constitutional:      Appearance: He is well-developed.  HENT:     Head: Normocephalic and atraumatic.  Eyes:      Conjunctiva/sclera: Conjunctivae normal.     Pupils: Pupils are equal, round, and reactive to light.  Cardiovascular:     Rate and Rhythm: Normal rate and regular rhythm.  Pulmonary:     Effort: Pulmonary effort is normal.     Breath sounds: Normal breath sounds.  Abdominal:     General: Bowel sounds are normal. There is no distension.     Palpations: Abdomen is soft.     Tenderness: There is no abdominal tenderness.     Comments: Left inguinal hernia  Musculoskeletal:        General: Normal range of motion.     Cervical back: Normal range of motion and neck supple.  Skin:    General: Skin is warm and dry.  Neurological:     Mental Status: He is alert and oriented to person, place, and time.  Psychiatric:        Behavior: Behavior normal.    Assessment/Plan 79 yo male with symptomatic left inguinal hernia -open left inguinal hernia repair with mesh -ERAS protocol -outpatient procedure  [name], MD 03/19/2020, 9:05 AM

## 2020-03-19 NOTE — Anesthesia Procedure Notes (Signed)
Procedure Name: LMA Insertion Date/Time: 03/19/2020 9:32 AM Performed by: Garrel Ridgel, CRNA Pre-anesthesia Checklist: Patient identified, Emergency Drugs available, Patient being monitored, Suction available and Timeout performed Patient Re-evaluated:Patient Re-evaluated prior to induction Oxygen Delivery Method: Circle system utilized Preoxygenation: Pre-oxygenation with 100% oxygen Induction Type: IV induction Ventilation: Mask ventilation without difficulty LMA: LMA with gastric port inserted LMA Size: 4.0 Number of attempts: 1 Placement Confirmation: positive ETCO2 Tube secured with: Tape Dental Injury: Teeth and Oropharynx as per pre-operative assessment

## 2020-03-19 NOTE — Progress Notes (Signed)
Assisted Dr. Houser with left, ultrasound guided, transabdominal plane block. Side rails up, monitors on throughout procedure. See vital signs in flow sheet. Tolerated Procedure well. °

## 2020-03-22 ENCOUNTER — Encounter (HOSPITAL_BASED_OUTPATIENT_CLINIC_OR_DEPARTMENT_OTHER): Payer: Self-pay | Admitting: General Surgery

## 2020-05-27 DIAGNOSIS — M25562 Pain in left knee: Secondary | ICD-10-CM | POA: Diagnosis not present

## 2020-05-27 DIAGNOSIS — I10 Essential (primary) hypertension: Secondary | ICD-10-CM | POA: Diagnosis not present

## 2020-05-27 DIAGNOSIS — G8929 Other chronic pain: Secondary | ICD-10-CM | POA: Diagnosis not present

## 2020-08-30 DIAGNOSIS — Z125 Encounter for screening for malignant neoplasm of prostate: Secondary | ICD-10-CM | POA: Diagnosis not present

## 2020-08-30 DIAGNOSIS — E785 Hyperlipidemia, unspecified: Secondary | ICD-10-CM | POA: Diagnosis not present

## 2020-08-30 DIAGNOSIS — R7301 Impaired fasting glucose: Secondary | ICD-10-CM | POA: Diagnosis not present

## 2020-09-06 DIAGNOSIS — R82998 Other abnormal findings in urine: Secondary | ICD-10-CM | POA: Diagnosis not present

## 2020-09-06 DIAGNOSIS — E669 Obesity, unspecified: Secondary | ICD-10-CM | POA: Diagnosis not present

## 2020-09-06 DIAGNOSIS — Z1212 Encounter for screening for malignant neoplasm of rectum: Secondary | ICD-10-CM | POA: Diagnosis not present

## 2020-09-06 DIAGNOSIS — R7301 Impaired fasting glucose: Secondary | ICD-10-CM | POA: Diagnosis not present

## 2020-09-06 DIAGNOSIS — H6123 Impacted cerumen, bilateral: Secondary | ICD-10-CM | POA: Diagnosis not present

## 2020-09-06 DIAGNOSIS — M25562 Pain in left knee: Secondary | ICD-10-CM | POA: Diagnosis not present

## 2020-09-06 DIAGNOSIS — Z0000 Encounter for general adult medical examination without abnormal findings: Secondary | ICD-10-CM | POA: Diagnosis not present

## 2020-09-06 DIAGNOSIS — E785 Hyperlipidemia, unspecified: Secondary | ICD-10-CM | POA: Diagnosis not present

## 2020-09-06 DIAGNOSIS — C61 Malignant neoplasm of prostate: Secondary | ICD-10-CM | POA: Diagnosis not present

## 2020-09-06 DIAGNOSIS — Z86010 Personal history of colonic polyps: Secondary | ICD-10-CM | POA: Diagnosis not present

## 2020-09-06 DIAGNOSIS — I10 Essential (primary) hypertension: Secondary | ICD-10-CM | POA: Diagnosis not present

## 2020-09-30 DIAGNOSIS — H2513 Age-related nuclear cataract, bilateral: Secondary | ICD-10-CM | POA: Diagnosis not present

## 2020-09-30 DIAGNOSIS — H52223 Regular astigmatism, bilateral: Secondary | ICD-10-CM | POA: Diagnosis not present

## 2020-09-30 DIAGNOSIS — H524 Presbyopia: Secondary | ICD-10-CM | POA: Diagnosis not present

## 2020-09-30 DIAGNOSIS — H53021 Refractive amblyopia, right eye: Secondary | ICD-10-CM | POA: Diagnosis not present

## 2020-09-30 DIAGNOSIS — H25013 Cortical age-related cataract, bilateral: Secondary | ICD-10-CM | POA: Diagnosis not present

## 2020-09-30 DIAGNOSIS — H5203 Hypermetropia, bilateral: Secondary | ICD-10-CM | POA: Diagnosis not present

## 2020-11-30 DIAGNOSIS — M25562 Pain in left knee: Secondary | ICD-10-CM | POA: Diagnosis not present

## 2020-11-30 DIAGNOSIS — C61 Malignant neoplasm of prostate: Secondary | ICD-10-CM | POA: Diagnosis not present

## 2020-11-30 DIAGNOSIS — E669 Obesity, unspecified: Secondary | ICD-10-CM | POA: Diagnosis not present

## 2020-11-30 DIAGNOSIS — I10 Essential (primary) hypertension: Secondary | ICD-10-CM | POA: Diagnosis not present

## 2020-11-30 DIAGNOSIS — E785 Hyperlipidemia, unspecified: Secondary | ICD-10-CM | POA: Diagnosis not present

## 2021-10-04 DIAGNOSIS — R7301 Impaired fasting glucose: Secondary | ICD-10-CM | POA: Diagnosis not present

## 2021-10-04 DIAGNOSIS — Z125 Encounter for screening for malignant neoplasm of prostate: Secondary | ICD-10-CM | POA: Diagnosis not present

## 2021-10-04 DIAGNOSIS — I10 Essential (primary) hypertension: Secondary | ICD-10-CM | POA: Diagnosis not present

## 2021-10-04 DIAGNOSIS — E785 Hyperlipidemia, unspecified: Secondary | ICD-10-CM | POA: Diagnosis not present

## 2021-10-11 DIAGNOSIS — Z1212 Encounter for screening for malignant neoplasm of rectum: Secondary | ICD-10-CM | POA: Diagnosis not present

## 2021-10-11 DIAGNOSIS — M25562 Pain in left knee: Secondary | ICD-10-CM | POA: Diagnosis not present

## 2021-10-11 DIAGNOSIS — Z1339 Encounter for screening examination for other mental health and behavioral disorders: Secondary | ICD-10-CM | POA: Diagnosis not present

## 2021-10-11 DIAGNOSIS — R82998 Other abnormal findings in urine: Secondary | ICD-10-CM | POA: Diagnosis not present

## 2021-10-11 DIAGNOSIS — Z8546 Personal history of malignant neoplasm of prostate: Secondary | ICD-10-CM | POA: Diagnosis not present

## 2021-10-11 DIAGNOSIS — Z0000 Encounter for general adult medical examination without abnormal findings: Secondary | ICD-10-CM | POA: Diagnosis not present

## 2021-10-11 DIAGNOSIS — R7301 Impaired fasting glucose: Secondary | ICD-10-CM | POA: Diagnosis not present

## 2021-10-11 DIAGNOSIS — I10 Essential (primary) hypertension: Secondary | ICD-10-CM | POA: Diagnosis not present

## 2021-10-11 DIAGNOSIS — H6123 Impacted cerumen, bilateral: Secondary | ICD-10-CM | POA: Diagnosis not present

## 2021-10-11 DIAGNOSIS — E785 Hyperlipidemia, unspecified: Secondary | ICD-10-CM | POA: Diagnosis not present

## 2021-10-11 DIAGNOSIS — Z1331 Encounter for screening for depression: Secondary | ICD-10-CM | POA: Diagnosis not present

## 2021-11-11 DIAGNOSIS — H52223 Regular astigmatism, bilateral: Secondary | ICD-10-CM | POA: Diagnosis not present

## 2021-11-11 DIAGNOSIS — H5203 Hypermetropia, bilateral: Secondary | ICD-10-CM | POA: Diagnosis not present

## 2021-11-11 DIAGNOSIS — H2513 Age-related nuclear cataract, bilateral: Secondary | ICD-10-CM | POA: Diagnosis not present

## 2021-11-11 DIAGNOSIS — H53021 Refractive amblyopia, right eye: Secondary | ICD-10-CM | POA: Diagnosis not present

## 2021-11-11 DIAGNOSIS — H25013 Cortical age-related cataract, bilateral: Secondary | ICD-10-CM | POA: Diagnosis not present

## 2021-11-11 DIAGNOSIS — H524 Presbyopia: Secondary | ICD-10-CM | POA: Diagnosis not present

## 2022-02-16 ENCOUNTER — Ambulatory Visit (INDEPENDENT_AMBULATORY_CARE_PROVIDER_SITE_OTHER): Payer: Medicare PPO | Admitting: Family Medicine

## 2022-02-16 ENCOUNTER — Encounter: Payer: Self-pay | Admitting: Family Medicine

## 2022-02-16 VITALS — BP 130/80 | HR 85 | Temp 98.0°F | Ht 70.0 in | Wt 210.1 lb

## 2022-02-16 DIAGNOSIS — M1711 Unilateral primary osteoarthritis, right knee: Secondary | ICD-10-CM

## 2022-02-16 MED ORDER — TRIAMCINOLONE ACETONIDE 40 MG/ML IJ SUSP
40.0000 mg | Freq: Once | INTRAMUSCULAR | Status: AC
  Administered 2022-02-16: 40 mg via INTRA_ARTICULAR

## 2022-02-16 NOTE — Progress Notes (Signed)
Robert Purdy T. Trudee Chirino, MD, [place] at [facility] [place], [address]  Phone: [phone]  FAX: [place] - 81 y.o. male  MRN [id number]  Date of Birth: 01-12-1941  Date: 02/16/2022  PCP: Donnajean Lopes, MD  Referral: Donnajean Lopes, MD  Chief Complaint  Patient presents with   Knee Pain    Left   Subjective:   Robert [name] is a 81 y.o. very pleasant male patient with Body mass index is 30.15 kg/m. who presents with the following:  Very pleasant 81 year old gentleman who is a patient of Dr. Philip Aspen at [place].  He has a known history of left-sided knee osteoarthritis, he presents today with acute exacerbation of pain.  He did have prior knee injection in April of this year, and he has done fairly well with some intermittent corticosteroid injections of the knee.  Does have some loss of motion and pain as well as some functional disability right now.  No prior knee history of surgery.  OA on the plain x-ray at his PCP - Dr. Philip Aspen at [place] is noted in the HPI, as appropriate  Objective:   BP 130/80   Pulse 85   Temp 98 F (36.7 C) (Oral)   Ht '5\' 10"'$  (1.778 m)   Wt 210 lb 2 oz (95.3 kg)   SpO2 95%   BMI 30.15 kg/m   GEN: No acute distress; alert,appropriate. PULM: Breathing comfortably in no respiratory distress PSYCH: Normally interactive.   Left knee: Lacks 4 degrees of extension and flexion to 120.  Does have some patellar crepitus.  Stable to varus and valgus stress.  Lachman is negative.  Does have significant medial greater than lateral joint line tenderness.  There is no significant effusion today.  Laboratory and Imaging Data:  Assessment and Plan:     ICD-10-CM   1. Primary osteoarthritis of right knee  M17.11      Chronic knee osteoarthritis with exacerbation.  He generally does fairly well some Tylenol and ibuprofen, but  he has having some acute flareup.  He has responded well to intra-articular injection in the past, so think is reasonable to repeat this today.  Aspiration/Injection Procedure Note Robert [name] Oct 29, 1940 Date of procedure: 02/16/2022  Procedure: Large Joint Aspiration / Injection of Knee, Right Indications: Pain  Procedure Details Patient verbally consented to procedure. Risks, benefits, and alternatives explained. Sterilely prepped with Chloraprep. Ethyl cholride used for anesthesia. 9 cc Lidocaine 1% mixed with 1 mL of Kenalog 40 mg injected using the anteromedial approach without difficulty. No complications with procedure and tolerated well. Patient had decreased pain post-injection. Medication: 1 mL of Kenalog 40 mg   Follow-up if needed: prn  [employer] One speech-to-text software was used for transcription in this dictation.  Possible transcriptional errors can occur using Editor, commissioning.   Signed,  Maud Deed. Sadie Pickar, MD   Outpatient Encounter Medications as of 02/16/2022  Medication Sig   aspirin 81 MG tablet Take 81 mg by mouth daily.   losartan-hydrochlorothiazide (HYZAAR) 100-12.5 MG tablet Take 1 tablet by mouth daily.    Multiple Vitamin (MULTIVITAMIN) tablet Take 1 tablet by mouth daily.   Omega-3 Fatty Acids (FISH OIL PO) Take 1,200 mg by mouth daily.   simvastatin (ZOCOR) 40 MG tablet Take 40 mg by mouth every evening.    [DISCONTINUED] oxyCODONE (OXY IR/ROXICODONE) 5 MG immediate release tablet  Take 1 tablet (5 mg total) by mouth every 6 (six) hours as needed for severe pain.   No facility-administered encounter medications on file as of 02/16/2022.

## 2022-02-28 DIAGNOSIS — D225 Melanocytic nevi of trunk: Secondary | ICD-10-CM | POA: Diagnosis not present

## 2022-02-28 DIAGNOSIS — L570 Actinic keratosis: Secondary | ICD-10-CM | POA: Diagnosis not present

## 2022-02-28 DIAGNOSIS — L728 Other follicular cysts of the skin and subcutaneous tissue: Secondary | ICD-10-CM | POA: Diagnosis not present

## 2022-02-28 DIAGNOSIS — L814 Other melanin hyperpigmentation: Secondary | ICD-10-CM | POA: Diagnosis not present

## 2022-02-28 DIAGNOSIS — L821 Other seborrheic keratosis: Secondary | ICD-10-CM | POA: Diagnosis not present

## 2022-05-29 DIAGNOSIS — M25562 Pain in left knee: Secondary | ICD-10-CM | POA: Diagnosis not present

## 2022-05-29 DIAGNOSIS — R7301 Impaired fasting glucose: Secondary | ICD-10-CM | POA: Diagnosis not present

## 2022-05-29 DIAGNOSIS — I10 Essential (primary) hypertension: Secondary | ICD-10-CM | POA: Diagnosis not present

## 2022-06-14 ENCOUNTER — Ambulatory Visit (INDEPENDENT_AMBULATORY_CARE_PROVIDER_SITE_OTHER): Payer: Medicare PPO

## 2022-06-14 ENCOUNTER — Ambulatory Visit (INDEPENDENT_AMBULATORY_CARE_PROVIDER_SITE_OTHER): Payer: Medicare PPO | Admitting: Orthopaedic Surgery

## 2022-06-14 DIAGNOSIS — M25562 Pain in left knee: Secondary | ICD-10-CM

## 2022-06-14 DIAGNOSIS — G8929 Other chronic pain: Secondary | ICD-10-CM

## 2022-06-14 DIAGNOSIS — M1712 Unilateral primary osteoarthritis, left knee: Secondary | ICD-10-CM

## 2022-06-14 NOTE — Progress Notes (Signed)
Office Visit Note   Patient: Robert [name]           Date of Birth: 07/06/1941           MRN: [id number] Visit Date: 06/14/2022              Requested by: Donnajean Lopes, MD [place],  [place] [address] PCP: Donnajean Lopes, MD   Assessment & Plan: Visit Diagnoses:  1. Chronic pain of left knee   2. Unilateral primary osteoarthritis, left knee     Plan: We spoke in length in detail about knee replacement surgery.  I showed him a knee replacement model and went over the surgery in detail in terms of what to expect from an intraoperative and postoperative course.  We discussed the risk and benefits of surgery.  I showed him his x-rays in detail.  All questions and concerns were answered and addressed.  He would like to pursue surgery sometime early next year after the holiday season.  He knows that we will be in touch about scheduling the surgery.  Follow-Up Instructions: Return for 2 weeks post-op.   Orders:  Orders Placed This Encounter  Procedures   XR Knee 1-2 Views Left   No orders of the defined types were placed in this encounter.     Procedures: No procedures performed   Clinical Data: No additional findings.   Subjective: Chief Complaint  Patient presents with   Left Knee - Pain  The patient is a very active and pleasant 81 year old gentleman sent from Dr. Bevelyn Buckles of [place] to evaluate and treat known obstructive arthritis of the patient's left knee.  This is been getting worse for over a year now.  He has tried conservative treatment measures with activity modification and strengthening of the quads.  He has had several steroid injections in his left knee.  At this point those are not working and only last for about a month.  His knee pain can become daily at times and it is 10 out of 10 when it does occur.  It is at this point detrimentally affecting his mobility, his quality of life and his actives daily living.   He comes in today and we are going to discuss knee replacement surgery at this point given his high level of function combined with the severe arthritis in his left knee and the failure of conservative treatment.  He is not a diabetic.  HPI  Review of Systems There is currently listed no headache, chest pain, shortness of breath, fever, chills, nausea, vomiting  Objective: Vital Signs: There were no vitals taken for this visit.  Physical Exam He is alert and oriented x 3 and in no acute distress Ortho Exam Examination of his left knee does show a moderate effusion.  There is significant patellofemoral crepitation throughout the arc of motion of his knee.  There is varus malalignment of the knee that is correctable.  He does have significant medial joint line tenderness as well. Specialty Comments:  No specialty comments available.  Imaging: XR Knee 1-2 Views Left  Result Date: 06/14/2022 2 views of the left knee show tricompartment arthritis.  There is varus malalignment and complete loss of medial joint space with bone-on-bone wear of all 3 compartments.  There is osteophytes in all 3 compartments.    PMFS History: Patient Active Problem List   Diagnosis Date Noted   Unilateral primary osteoarthritis, left knee 06/14/2022   Malignant neoplasm  of prostate ([place]) 12/10/2017   Past Medical History:  Diagnosis Date   Diverticulosis, sigmoid 08/12/2014   Moderate, noted on colonoscopy   History of adenomatous polyp of colon    Hyperlipidemia    Hypertension    followed by pcp   (03-16-2020  per pt had stress test several years ago, told ok)   Left inguinal hernia    Prostate cancer [facility]) urologist-- dr Junious Silk   dx 2019,  Stage T1c, Gleason 3+4;  s/p  brachytherapy 03-08-2018   Wears glasses     Family History  Problem Relation Age of Onset   Cancer Paternal Grandfather        unknown/bone   Colon cancer Neg Hx    Colon polyps Neg Hx    Esophageal cancer Neg Hx    Rectal  cancer Neg Hx    Stomach cancer Neg Hx     Past Surgical History:  Procedure Laterality Date   COLONOSCOPY W/ POLYPECTOMY  last one 12-18-2019  dr Henrene Pastor   CYSTOSCOPY N/A 03/08/2018   Procedure: CYSTOSCOPY;  Surgeon: Festus Aloe, MD;  Location: [facility];  Service: Urology;  Laterality: N/A;  no seeds in bladder per dr eskridge   INGUINAL HERNIA REPAIR Left 03/19/2020   Procedure: LEFT INGUINAL HERNIA REPAIR;  Surgeon: Kieth Brightly, Arta Bruce, MD;  Location: [facility];  Service: General;  Laterality: Left;   INSERTION OF MESH Left 03/19/2020   Procedure: INSERTION OF MESH;  Surgeon: Kinsinger, Arta Bruce, MD;  Location: [facility];  Service: General;  Laterality: Left;   PROSTATE BIOPSY  10/16/2017   RADIOACTIVE SEED IMPLANT N/A 03/08/2018   Procedure: RADIOACTIVE SEED IMPLANT/BRACHYTHERAPY IMPLANT;  Surgeon: Festus Aloe, MD;  Location: [place];  Service: Urology;  Laterality: N/A;  86 seeds   SPACE OAR INSTILLATION N/A 03/08/2018   Procedure: SPACE OAR INSTILLATION;  Surgeon: Festus Aloe, MD;  Location: [facility];  Service: Urology;  Laterality: N/A;   TONSILLECTOMY  child   Social History   Occupational History    Comment: retired Optometrist  Tobacco Use   Smoking status: Never   Smokeless tobacco: Never  Vaping Use   Vaping Use: Never used  Substance and Sexual Activity   Alcohol use: Not Currently    Alcohol/week: 0.0 standard drinks of alcohol   Drug use: Never   Sexual activity: Not on file

## 2022-07-18 ENCOUNTER — Other Ambulatory Visit: Payer: Self-pay

## 2022-08-10 ENCOUNTER — Other Ambulatory Visit: Payer: Self-pay | Admitting: Physician Assistant

## 2022-08-10 DIAGNOSIS — Z01818 Encounter for other preprocedural examination: Secondary | ICD-10-CM

## 2022-08-14 NOTE — Patient Instructions (Addendum)
SURGICAL WAITING ROOM VISITATION Patients having surgery or a procedure may have no more than 2 support people in the waiting area - these visitors may rotate.    If the patient needs to stay at the hospital during part of their recovery, the visitor guidelines for inpatient rooms apply. Pre-op nurse will coordinate an appropriate time for 1 support person to accompany patient in pre-op.  This support person may not rotate.    Please refer to the [facility] website for the visitor guidelines for Inpatients (after your surgery is over and you are in a regular room).   Due to an increase in RSV and influenza rates and associated hospitalizations, children ages 46 and under may not visit patients in [place].     Your procedure is scheduled on: 08-25-22   Report to [facility] Main Entrance    Report to admitting at 6:00 AM   Call this number if you have problems the morning of surgery [phone]   Do not eat food :After Midnight.   After Midnight you may have the following liquids until 5:30 AM DAY OF SURGERY  Water Non-Citrus Juices (without pulp, NO RED) Carbonated Beverages Black Coffee (NO MILK/CREAM OR CREAMERS, sugar ok)  Clear Tea (NO MILK/CREAM OR CREAMERS, sugar ok) regular and decaf                             Plain Jell-O (NO RED)                                           Fruit ices (not with fruit pulp, NO RED)                                     Popsicles (NO RED)                                                               Sports drinks like Gatorade (NO RED)                   The day of surgery:  Drink ONE (1) Pre-Surgery Clear Ensure at 5:30 AM the morning of surgery. Drink in one sitting. Do not sip.  This drink was given to you during your hospital  pre-op appointment visit. Nothing else to drink after completing the Pre-Surgery Clear Ensure.          If you have questions, please contact your surgeon's office.   FOLLOW  ANY  ADDITIONAL PRE OP INSTRUCTIONS YOU RECEIVED FROM YOUR SURGEON'S OFFICE!!!     Oral Hygiene is also important to reduce your risk of infection.                                    Remember - BRUSH YOUR TEETH THE MORNING OF SURGERY WITH YOUR REGULAR TOOTHPASTE   Do NOT smoke after Midnight   Take these medicines the morning of surgery with A SIP OF WATER:  None  Bring CPAP mask and tubing day of surgery.                              You may not have any metal on your body including  jewelry, and body piercing             Do not wear lotions, powders, cologne, or deodorant              Men may shave face and neck.   Do not bring valuables to the hospital. [place].   Contacts, dentures or bridgework may not be worn into surgery.   Bring small overnight bag day of surgery.   DO NOT [place]. PHARMACY WILL DISPENSE MEDICATIONS LISTED ON YOUR MEDICATION LIST TO YOU DURING YOUR ADMISSION [place]!    Patients discharged on the day of surgery will not be allowed to drive home.  Someone NEEDS to stay with you for the first 24 hours after anesthesia.   Special Instructions: Bring a copy of your healthcare power of attorney and living will documents the day of surgery if you haven't scanned them before.              Please read over the following fact sheets you were given: IF [place]  If you received a COVID test during your pre-op visit  it is requested that you wear a mask when out in public, stay away from anyone that may not be feeling well and notify your surgeon if you develop symptoms. If you test positive for Covid or have been in contact with anyone that has tested positive in the last 10 days please notify you surgeon.  [place] - Preparing for Surgery Before surgery, you can play an important role.  Because skin is not sterile,  your skin needs to be as free of germs as possible.  You can reduce the number of germs on your skin by washing with CHG (chlorahexidine gluconate) soap before surgery.  CHG is an antiseptic cleaner which kills germs and bonds with the skin to continue killing germs even after washing. Please DO NOT use if you have an allergy to CHG or antibacterial soaps.  If your skin becomes reddened/irritated stop using the CHG and inform your nurse when you arrive at Short Stay. Do not shave (including legs and underarms) for at least 48 hours prior to the first CHG shower.  You may shave your face/neck.  Please follow these instructions carefully:  1.  Shower with CHG Soap the night before surgery and the  morning of surgery.  2.  If you choose to wash your hair, wash your hair first as usual with your normal  shampoo.  3.  After you shampoo, rinse your hair and body thoroughly to remove the shampoo.                             4.  Use CHG as you would any other liquid soap.  You can apply chg directly to the skin and wash.  Gently with a scrungie or clean washcloth.  5.  Apply the CHG Soap to your body ONLY FROM THE NECK DOWN.   Do   not use on face/ open  Wound or open sores. Avoid contact with eyes, ears mouth and   genitals (private parts).                       Wash face,  Genitals (private parts) with your normal soap.             6.  Wash thoroughly, paying special attention to the area where your    surgery  will be performed.  7.  Thoroughly rinse your body with warm water from the neck down.  8.  DO NOT shower/wash with your normal soap after using and rinsing off the CHG Soap.                9.  Pat yourself dry with a clean towel.            10.  Wear clean pajamas.            11.  Place clean sheets on your bed the night of your first shower and do not  sleep with pets. Day of Surgery : Do not apply any lotions/deodorants the morning of surgery.  Please wear clean  clothes to the hospital/surgery center.  FAILURE TO FOLLOW THESE INSTRUCTIONS MAY RESULT IN THE CANCELLATION OF YOUR SURGERY  PATIENT SIGNATURE_________________________________  NURSE SIGNATURE__________________________________  ________________________________________________________________________    Robert [name]  An incentive spirometer is a tool that can help keep your lungs clear and active. This tool measures how well you are filling your lungs with each breath. Taking long deep breaths may help reverse or decrease the chance of developing breathing (pulmonary) problems (especially infection) following: A long period of time when you are unable to move or be active. BEFORE THE PROCEDURE  If the spirometer includes an indicator to show your best effort, your nurse or respiratory therapist will set it to a desired goal. If possible, sit up straight or lean slightly forward. Try not to slouch. Hold the incentive spirometer in an upright position. INSTRUCTIONS FOR USE  Sit on the edge of your bed if possible, or sit up as far as you can in bed or on a chair. Hold the incentive spirometer in an upright position. Breathe out normally. Place the mouthpiece in your mouth and seal your lips tightly around it. Breathe in slowly and as deeply as possible, raising the piston or the ball toward the top of the column. Hold your breath for 3-5 seconds or for as long as possible. Allow the piston or ball to fall to the bottom of the column. Remove the mouthpiece from your mouth and breathe out normally. Rest for a few seconds and repeat Steps 1 through 7 at least 10 times every 1-2 hours when you are awake. Take your time and take a few normal breaths between deep breaths. The spirometer may include an indicator to show your best effort. Use the indicator as a goal to work toward during each repetition. After each set of 10 deep breaths, practice coughing to be sure your lungs are clear.  If you have an incision (the cut made at the time of surgery), support your incision when coughing by placing a pillow or rolled up towels firmly against it. Once you are able to get out of bed, walk around indoors and cough well. You may stop using the incentive spirometer when instructed by your caregiver.  RISKS AND COMPLICATIONS Take your time so you do not get dizzy or light-headed. If you are in pain,  you may need to take or ask for pain medication before doing incentive spirometry. It is harder to take a deep breath if you are having pain. AFTER USE Rest and breathe slowly and easily. It can be helpful to keep track of a log of your progress. Your caregiver can provide you with a simple table to help with this. If you are using the spirometer at home, follow these instructions: [place] IF:  You are having difficultly using the spirometer. You have trouble using the spirometer as often as instructed. Your pain medication is not giving enough relief while using the spirometer. You develop fever of 100.5 F (38.1 C) or higher. SEEK IMMEDIATE MEDICAL CARE IF:  You cough up bloody sputum that had not been present before. You develop fever of 102 F (38.9 C) or greater. You develop worsening pain at or near the incision site. MAKE SURE YOU:  Understand these instructions. Will watch your condition. Will get help right away if you are not doing well or get worse. Document Released: 11/06/2006 Document Revised: 09/18/2011 Document Reviewed: 01/07/2007 ExitCare Patient Information 2014 ExitCare, [place].   ________________________________________________________________________ WHAT IS A BLOOD TRANSFUSION? Blood Transfusion Information  A transfusion is the replacement of blood or some of its parts. Blood is made up of multiple cells which provide different functions. Red blood cells carry oxygen and are used for blood loss replacement. White blood cells fight against  infection. Platelets control bleeding. Plasma helps clot blood. Other blood products are available for specialized needs, such as hemophilia or other clotting disorders. BEFORE THE TRANSFUSION  Who gives blood for transfusions?  Healthy volunteers who are fully evaluated to make sure their blood is safe. This is blood bank blood. Transfusion therapy is the safest it has ever been in the practice of medicine. Before blood is taken from a donor, a complete history is taken to make sure that person has no history of diseases nor engages in risky social behavior (examples are intravenous drug use or sexual activity with multiple partners). The donor's travel history is screened to minimize risk of transmitting infections, such as malaria. The donated blood is tested for signs of infectious diseases, such as HIV and hepatitis. The blood is then tested to be sure it is compatible with you in order to minimize the chance of a transfusion reaction. If you or a relative donates blood, this is often done in anticipation of surgery and is not appropriate for emergency situations. It takes many days to process the donated blood. RISKS AND COMPLICATIONS Although transfusion therapy is very safe and saves many lives, the main dangers of transfusion include:  Getting an infectious disease. Developing a transfusion reaction. This is an allergic reaction to something in the blood you were given. Every precaution is taken to prevent this. The decision to have a blood transfusion has been considered carefully by your caregiver before blood is given. Blood is not given unless the benefits outweigh the risks. AFTER THE TRANSFUSION Right after receiving a blood transfusion, you will usually feel much better and more energetic. This is especially true if your red blood cells have gotten low (anemic). The transfusion raises the level of the red blood cells which carry oxygen, and this usually causes an energy increase. The  nurse administering the transfusion will monitor you carefully for complications. HOME CARE INSTRUCTIONS  No special instructions are needed after a transfusion. You may find your energy is better. Speak with your caregiver about any limitations on  activity for underlying diseases you may have. SEEK MEDICAL CARE IF:  Your condition is not improving after your transfusion. You develop redness or irritation at the intravenous (IV) site. SEEK IMMEDIATE MEDICAL CARE IF:  Any of the following symptoms occur over the next 12 hours: Shaking chills. You have a temperature by mouth above 102 F (38.9 C), not controlled by medicine. Chest, back, or muscle pain. People around you feel you are not acting correctly or are confused. Shortness of breath or difficulty breathing. Dizziness and fainting. You get a rash or develop hives. You have a decrease in urine output. Your urine turns a dark color or changes to pink, red, or brown. Any of the following symptoms occur over the next 10 days: You have a temperature by mouth above 102 F (38.9 C), not controlled by medicine. Shortness of breath. Weakness after normal activity. The white part of the eye turns yellow (jaundice). You have a decrease in the amount of urine or are urinating less often. Your urine turns a dark color or changes to pink, red, or brown. Document Released: 06/23/2000 Document Revised: 09/18/2011 Document Reviewed: 02/10/2008 [facility] Patient Information 2014 [place], [place].  _______________________________________________________________________

## 2022-08-16 ENCOUNTER — Encounter (HOSPITAL_COMMUNITY): Payer: Self-pay

## 2022-08-16 ENCOUNTER — Encounter (HOSPITAL_COMMUNITY)
Admission: RE | Admit: 2022-08-16 | Discharge: 2022-08-16 | Disposition: A | Discharge status: Home / Self Care | Payer: Medicare PPO | Source: Ambulatory Visit | Attending: Orthopaedic Surgery | Admitting: Orthopaedic Surgery

## 2022-08-16 ENCOUNTER — Other Ambulatory Visit: Payer: Self-pay

## 2022-08-16 VITALS — BP 140/88 | HR 84 | Temp 98.0°F | Ht 70.0 in | Wt 207.0 lb

## 2022-08-16 DIAGNOSIS — Z01818 Encounter for other preprocedural examination: Secondary | ICD-10-CM

## 2022-08-16 DIAGNOSIS — I10 Essential (primary) hypertension: Secondary | ICD-10-CM | POA: Diagnosis not present

## 2022-08-16 HISTORY — DX: Unspecified osteoarthritis, unspecified site: M19.90

## 2022-08-16 LAB — COMPREHENSIVE METABOLIC PANEL
ALT: 20 U/L (ref 0–44)
AST: 27 U/L (ref 15–41)
Albumin: 4.2 g/dL (ref 3.5–5.0)
Alkaline Phosphatase: 53 U/L (ref 38–126)
Anion gap: 11 (ref 5–15)
BUN: 29 mg/dL — ABNORMAL HIGH (ref 8–23)
CO2: 26 mmol/L (ref 22–32)
Calcium: 9.3 mg/dL (ref 8.9–10.3)
Chloride: 101 mmol/L (ref 98–111)
Creatinine, Ser: 1.06 mg/dL (ref 0.61–1.24)
GFR, Estimated: >60 mL/min (ref >60)
Glucose, Bld: 99 mg/dL (ref 70–99)
Potassium: 4.1 mmol/L (ref 3.5–5.1)
Sodium: 138 mmol/L (ref 135–145)
Total Bilirubin: 0.7 mg/dL (ref 0.3–1.2)
Total Protein: 8 g/dL (ref 6.5–8.1)

## 2022-08-16 LAB — CBC
HCT: 46.5 % (ref 39.0–52.0)
Hemoglobin: 15.6 g/dL (ref 13.0–17.0)
MCH: 30.8 pg (ref 26.0–34.0)
MCHC: 33.5 g/dL (ref 30.0–36.0)
MCV: 91.7 fL (ref 80.0–100.0)
Platelets: 243 10*3/uL (ref 150–400)
RBC: 5.07 MIL/uL (ref 4.22–5.81)
RDW: 12.9 % (ref 11.5–15.5)
WBC: 8.4 10*3/uL (ref 4.0–10.5)
nRBC: 0 % (ref 0.0–0.2)

## 2022-08-16 LAB — SURGICAL PCR SCREEN
MRSA, PCR: NEGATIVE
Staphylococcus aureus: POSITIVE — AB

## 2022-08-16 NOTE — Progress Notes (Signed)
PCR: + STAPH °

## 2022-08-16 NOTE — Progress Notes (Signed)
For Short Stay: [place] appointment date:  Bowel Prep reminder:   For Anesthesia: PCP - Dr. Leanna Battles. Cardiologist - N/A  Chest x-ray -  EKG -  Stress Test -  ECHO -  Cardiac Cath -  Pacemaker/ICD device last checked: Pacemaker orders received: Device Rep notified:  Spinal Cord Stimulator:  Sleep Study -  CPAP -   Fasting Blood Sugar -  Checks Blood Sugar _____ times a day Date and result of last Hgb A1c-  Last dose of GLP1 agonist-  GLP1 instructions:   Last dose of SGLT-2 inhibitors-  SGLT-2 instructions:   Blood Thinner Instructions: Aspirin Instructions: Last Dose:  Activity level: Can go up a flight of stairs and activities of daily living without stopping and without chest pain and/or shortness of breath   Able to exercise without chest pain and/or shortness of breath     Anesthesia review: Hx: HTN  Patient denies shortness of breath, fever, cough and chest pain at PAT appointment   Patient verbalized understanding of instructions that were given to them at the PAT appointment. Patient was also instructed that they will need to review over the PAT instructions again at home before surgery.

## 2022-08-24 ENCOUNTER — Telehealth: Payer: Self-pay | Admitting: *Deleted

## 2022-08-24 ENCOUNTER — Other Ambulatory Visit: Payer: Self-pay | Admitting: *Deleted

## 2022-08-24 DIAGNOSIS — M1712 Unilateral primary osteoarthritis, left knee: Secondary | ICD-10-CM

## 2022-08-24 NOTE — Telephone Encounter (Signed)
Attempted Ortho bundle pre-op call to patient; no answer and left VM on home number. Will try again later if don't hear back.

## 2022-08-24 NOTE — Care Plan (Signed)
[facility] RNCM call to patient prior to surgery to discuss his upcoming Left total knee arthroplasty with Dr. Ninfa Linden on 08/25/22. He is an Ortho bundle patient through [facility] and is agreeable to case management. He plans on going back to his home after discharge and will be assisted by family (son in law). Anticipate HHPT will be needed after short hospital stay. Referral made to [facility] after choice provided. Reviewed all post op care instructions. Will continue to follow for needs.

## 2022-08-24 NOTE — H&P (Signed)
TOTAL KNEE ADMISSION H&P  Patient is being admitted for left total knee arthroplasty.  Subjective:  Chief Complaint:left knee pain.  HPI: Robert [name], 82 y.o. male, has a history of pain and functional disability in the left knee due to arthritis and has failed non-surgical conservative treatments for greater than 12 weeks to includeNSAID's and/or analgesics, corticosteriod injections, flexibility and strengthening excercises, and activity modification.  Onset of symptoms was gradual, starting 2 years ago with gradually worsening course since that time. The patient noted no past surgery on the left knee(s).  Patient currently rates pain in the left knee(s) at 10 out of 10 with activity. Patient has night pain, worsening of pain with activity and weight bearing, pain that interferes with activities of daily living, pain with passive range of motion, crepitus, and joint swelling.  Patient has evidence of subchondral sclerosis, periarticular osteophytes, and joint space narrowing by imaging studies. There is no active infection.  Patient Active Problem List   Diagnosis Date Noted   Unilateral primary osteoarthritis, left knee 06/14/2022   Malignant neoplasm of prostate ([place]) 12/10/2017   Past Medical History:  Diagnosis Date   Arthritis    Diverticulosis, sigmoid 08/12/2014   Moderate, noted on colonoscopy   History of adenomatous polyp of colon    Hyperlipidemia    Hypertension    followed by pcp   (03-16-2020  per pt had stress test several years ago, told ok)   Left inguinal hernia    Prostate cancer [facility]) urologist-- dr Junious Silk   dx 2019,  Stage T1c, Gleason 3+4;  s/p  brachytherapy 03-08-2018   Wears glasses     Past Surgical History:  Procedure Laterality Date   COLONOSCOPY W/ POLYPECTOMY  last one 12-18-2019  dr Henrene Pastor   CYSTOSCOPY N/A 03/08/2018   Procedure: CYSTOSCOPY;  Surgeon: Festus Aloe, MD;  Location: [facility];  Service: Urology;  Laterality: N/A;   no seeds in bladder per dr eskridge   INGUINAL HERNIA REPAIR Left 03/19/2020   Procedure: LEFT INGUINAL HERNIA REPAIR;  Surgeon: Kieth Brightly, Arta Bruce, MD;  [facility];  Service: General;  Laterality: Left;   INSERTION OF MESH Left 03/19/2020   Procedure: INSERTION OF MESH;  Surgeon: Kinsinger, Arta Bruce, MD;  Location: [facility];  Service: General;  Laterality: Left;   PROSTATE BIOPSY  10/16/2017   RADIOACTIVE SEED IMPLANT N/A 03/08/2018   Procedure: RADIOACTIVE SEED IMPLANT/BRACHYTHERAPY IMPLANT;  Surgeon: Festus Aloe, MD;  Location: [place];  Service: Urology;  Laterality: N/A;  86 seeds   SPACE OAR INSTILLATION N/A 03/08/2018   Procedure: SPACE OAR INSTILLATION;  Surgeon: Festus Aloe, MD;  Location: [facility];  Service: Urology;  Laterality: N/A;   TONSILLECTOMY  child    No current facility-administered medications for this encounter.   Current Outpatient Medications  Medication Sig Dispense Refill Last Dose   aspirin 81 MG tablet Take 81 mg by mouth at bedtime.      losartan-hydrochlorothiazide (HYZAAR) 100-12.5 MG tablet Take 1 tablet by mouth daily.       Multiple Vitamin (MULTIVITAMIN) tablet Take 1 tablet by mouth in the morning.      Omega-3 Fatty Acids (FISH OIL PO) Take 2,000 mg by mouth in the morning.      simvastatin (ZOCOR) 40 MG tablet Take 40 mg by mouth at bedtime.      No Known Allergies  Social History   Tobacco Use   Smoking status: Never  Smokeless tobacco: Never  Substance Use Topics   Alcohol use: Not Currently    Alcohol/week: 0.0 standard drinks of alcohol    Family History  Problem Relation Age of Onset   Cancer Paternal Grandfather        unknown/bone   Colon cancer Neg Hx    Colon polyps Neg Hx    Esophageal cancer Neg Hx    Rectal cancer Neg Hx    Stomach cancer Neg Hx      Review of Systems  Musculoskeletal:  Positive for gait problem and joint  swelling.  All other systems reviewed and are negative.   Objective:  Physical Exam Vitals reviewed.  Constitutional:      Appearance: Normal appearance.  HENT:     Head: Normocephalic and atraumatic.  Eyes:     Extraocular Movements: Extraocular movements intact.     Pupils: Pupils are equal, round, and reactive to light.  Cardiovascular:     Rate and Rhythm: Normal rate.  Pulmonary:     Effort: Pulmonary effort is normal.     Breath sounds: Normal breath sounds.  Abdominal:     Palpations: Abdomen is soft.  Musculoskeletal:     Cervical back: Normal range of motion and neck supple.     Left knee: Effusion, bony tenderness and crepitus present. Decreased range of motion. Tenderness present over the medial joint line. Abnormal alignment.  Neurological:     Mental Status: He is alert and oriented to person, place, and time.  Psychiatric:        Behavior: Behavior normal.     Vital signs in last 24 hours:    Labs:   Estimated body mass index is 29.7 kg/m as calculated from the following:   Height as of 08/16/22: 5' 10"$  (1.778 m).   Weight as of 08/16/22: 93.9 kg.   Imaging Review Plain radiographs demonstrate severe degenerative joint disease of the left knee(s). The overall alignment ismild varus. The bone quality appears to be good for age and reported activity level.      Assessment/Plan:  End stage arthritis, left knee   The patient history, physical examination, clinical judgment of the provider and imaging studies are consistent with end stage degenerative joint disease of the left knee(s) and total knee arthroplasty is deemed medically necessary. The treatment options including medical management, injection therapy arthroscopy and arthroplasty were discussed at length. The risks and benefits of total knee arthroplasty were presented and reviewed. The risks due to aseptic loosening, infection, stiffness, patella tracking problems, thromboembolic complications  and other imponderables were discussed. The patient acknowledged the explanation, agreed to proceed with the plan and consent was signed. Patient is being admitted for inpatient treatment for surgery, pain control, PT, OT, prophylactic antibiotics, VTE prophylaxis, progressive ambulation and ADL's and discharge planning. The patient is planning to be discharged home with home health services

## 2022-08-24 NOTE — Telephone Encounter (Signed)
Ortho bundle pre-op call completed. 

## 2022-08-25 ENCOUNTER — Encounter (HOSPITAL_COMMUNITY): Payer: Self-pay | Admitting: Orthopaedic Surgery

## 2022-08-25 ENCOUNTER — Ambulatory Visit (HOSPITAL_COMMUNITY): Payer: Medicare PPO | Admitting: Anesthesiology

## 2022-08-25 ENCOUNTER — Other Ambulatory Visit: Payer: Self-pay

## 2022-08-25 ENCOUNTER — Observation Stay (HOSPITAL_COMMUNITY): Payer: Medicare PPO

## 2022-08-25 ENCOUNTER — Encounter (HOSPITAL_COMMUNITY)
Admission: RE | Disposition: A | Discharge status: Home / Self Care | Payer: Self-pay | Source: Home / Self Care | Attending: Orthopaedic Surgery

## 2022-08-25 ENCOUNTER — Observation Stay (HOSPITAL_COMMUNITY)
Admission: RE | Admit: 2022-08-25 | Discharge: 2022-08-28 | Disposition: A | Discharge status: Home / Self Care | Payer: Medicare PPO | Attending: Orthopaedic Surgery | Admitting: Orthopaedic Surgery

## 2022-08-25 ENCOUNTER — Ambulatory Visit (HOSPITAL_BASED_OUTPATIENT_CLINIC_OR_DEPARTMENT_OTHER): Payer: Medicare PPO | Admitting: Anesthesiology

## 2022-08-25 DIAGNOSIS — M1712 Unilateral primary osteoarthritis, left knee: Secondary | ICD-10-CM | POA: Diagnosis not present

## 2022-08-25 DIAGNOSIS — I10 Essential (primary) hypertension: Secondary | ICD-10-CM | POA: Diagnosis not present

## 2022-08-25 DIAGNOSIS — Z8546 Personal history of malignant neoplasm of prostate: Secondary | ICD-10-CM | POA: Diagnosis not present

## 2022-08-25 DIAGNOSIS — G8918 Other acute postprocedural pain: Secondary | ICD-10-CM | POA: Diagnosis not present

## 2022-08-25 DIAGNOSIS — Z7982 Long term (current) use of aspirin: Secondary | ICD-10-CM | POA: Insufficient documentation

## 2022-08-25 DIAGNOSIS — Z96652 Presence of left artificial knee joint: Secondary | ICD-10-CM

## 2022-08-25 DIAGNOSIS — Z471 Aftercare following joint replacement surgery: Secondary | ICD-10-CM | POA: Diagnosis not present

## 2022-08-25 DIAGNOSIS — Z79899 Other long term (current) drug therapy: Secondary | ICD-10-CM | POA: Insufficient documentation

## 2022-08-25 HISTORY — PX: TOTAL KNEE ARTHROPLASTY: SHX125

## 2022-08-25 LAB — TYPE AND SCREEN
ABO/RH(D): O POS
Antibody Screen: NEGATIVE

## 2022-08-25 LAB — ABO/RH: ABO/RH(D): O POS

## 2022-08-25 SURGERY — ARTHROPLASTY, KNEE, TOTAL
Anesthesia: Spinal | Site: Knee | Laterality: Left

## 2022-08-25 MED ORDER — LOSARTAN POTASSIUM 50 MG PO TABS
100.0000 mg | ORAL_TABLET | Freq: Every day | ORAL | Status: DC
  Administered 2022-08-26 – 2022-08-28 (×3): 100 mg via ORAL
  Filled 2022-08-25 (×3): qty 2

## 2022-08-25 MED ORDER — TRANEXAMIC ACID-NACL 1000-0.7 MG/100ML-% IV SOLN
1000.0000 mg | INTRAVENOUS | Status: AC
  Administered 2022-08-25: 1000 mg via INTRAVENOUS
  Filled 2022-08-25: qty 100

## 2022-08-25 MED ORDER — OXYCODONE HCL 5 MG PO TABS
5.0000 mg | ORAL_TABLET | ORAL | Status: DC | PRN
  Administered 2022-08-25 (×2): 5 mg via ORAL
  Administered 2022-08-26: 10 mg via ORAL
  Administered 2022-08-26 (×2): 5 mg via ORAL
  Administered 2022-08-27 – 2022-08-28 (×3): 10 mg via ORAL
  Filled 2022-08-25 (×2): qty 1
  Filled 2022-08-25: qty 2
  Filled 2022-08-25: qty 1
  Filled 2022-08-25: qty 2
  Filled 2022-08-25: qty 1
  Filled 2022-08-25 (×2): qty 2

## 2022-08-25 MED ORDER — LOSARTAN POTASSIUM-HCTZ 100-12.5 MG PO TABS: 1.0000 | ORAL_TABLET | Freq: Every day | ORAL | Status: DC

## 2022-08-25 MED ORDER — HYDROCHLOROTHIAZIDE 12.5 MG PO TABS
12.5000 mg | ORAL_TABLET | Freq: Every day | ORAL | Status: DC
  Administered 2022-08-26 – 2022-08-28 (×3): 12.5 mg via ORAL
  Filled 2022-08-25 (×3): qty 1

## 2022-08-25 MED ORDER — PANTOPRAZOLE SODIUM 40 MG PO TBEC
40.0000 mg | DELAYED_RELEASE_TABLET | Freq: Every day | ORAL | Status: DC
  Administered 2022-08-25 – 2022-08-28 (×4): 40 mg via ORAL
  Filled 2022-08-25 (×4): qty 1

## 2022-08-25 MED ORDER — DEXMEDETOMIDINE HCL IN NACL 80 MCG/20ML IV SOLN
INTRAVENOUS | Status: AC
  Filled 2022-08-25: qty 20

## 2022-08-25 MED ORDER — FENTANYL CITRATE PF 50 MCG/ML IJ SOSY
50.0000 ug | PREFILLED_SYRINGE | INTRAMUSCULAR | Status: DC
  Administered 2022-08-25: 50 ug via INTRAVENOUS
  Filled 2022-08-25: qty 2

## 2022-08-25 MED ORDER — DOCUSATE SODIUM 100 MG PO CAPS
100.0000 mg | ORAL_CAPSULE | Freq: Two times a day (BID) | ORAL | Status: DC
  Administered 2022-08-25 – 2022-08-28 (×6): 100 mg via ORAL
  Filled 2022-08-25 (×6): qty 1

## 2022-08-25 MED ORDER — SODIUM CHLORIDE 0.9 % IR SOLN
Status: DC | PRN
  Administered 2022-08-25: 1000 mL

## 2022-08-25 MED ORDER — LIDOCAINE HCL (PF) 2 % IJ SOLN
INTRAMUSCULAR | Status: AC
  Filled 2022-08-25: qty 5

## 2022-08-25 MED ORDER — OXYCODONE HCL 5 MG PO TABS: 5.0000 mg | ORAL_TABLET | Freq: Once | ORAL | Status: DC | PRN

## 2022-08-25 MED ORDER — BUPIVACAINE HCL (PF) 0.25 % IJ SOLN
INTRAMUSCULAR | Status: DC | PRN
  Administered 2022-08-25: 30 mL

## 2022-08-25 MED ORDER — POVIDONE-IODINE 10 % EX SWAB: 2.0000 | Freq: Once | CUTANEOUS | Status: AC

## 2022-08-25 MED ORDER — METOCLOPRAMIDE HCL 5 MG/ML IJ SOLN
5.0000 mg | Freq: Three times a day (TID) | INTRAMUSCULAR | Status: DC | PRN
  Administered 2022-08-27: 10 mg via INTRAVENOUS
  Filled 2022-08-25: qty 2

## 2022-08-25 MED ORDER — CHLORHEXIDINE GLUCONATE 0.12 % MT SOLN
15.0000 mL | Freq: Once | OROMUCOSAL | Status: AC
  Administered 2022-08-25: 15 mL via OROMUCOSAL

## 2022-08-25 MED ORDER — BUPIVACAINE HCL 0.25 % IJ SOLN
INTRAMUSCULAR | Status: AC
  Filled 2022-08-25: qty 1

## 2022-08-25 MED ORDER — PROPOFOL 1000 MG/100ML IV EMUL
INTRAVENOUS | Status: AC
  Filled 2022-08-25: qty 100

## 2022-08-25 MED ORDER — FENTANYL CITRATE (PF) 100 MCG/2ML IJ SOLN
INTRAMUSCULAR | Status: AC
  Filled 2022-08-25: qty 2

## 2022-08-25 MED ORDER — ROPIVACAINE HCL 7.5 MG/ML IJ SOLN
INTRAMUSCULAR | Status: DC | PRN
  Administered 2022-08-25: 20 mL via PERINEURAL

## 2022-08-25 MED ORDER — 0.9 % SODIUM CHLORIDE (POUR BTL) OPTIME
TOPICAL | Status: DC | PRN
  Administered 2022-08-25: 1000 mL

## 2022-08-25 MED ORDER — OXYCODONE HCL 5 MG PO TABS
10.0000 mg | ORAL_TABLET | ORAL | Status: DC | PRN
  Administered 2022-08-26: 10 mg via ORAL
  Filled 2022-08-25: qty 2

## 2022-08-25 MED ORDER — ORAL CARE MOUTH RINSE: 15.0000 mL | Freq: Once | OROMUCOSAL | Status: AC

## 2022-08-25 MED ORDER — OXYCODONE HCL 5 MG/5ML PO SOLN: 5.0000 mg | Freq: Once | ORAL | Status: DC | PRN

## 2022-08-25 MED ORDER — LIDOCAINE HCL (CARDIAC) PF 100 MG/5ML IV SOSY
PREFILLED_SYRINGE | INTRAVENOUS | Status: DC | PRN
  Administered 2022-08-25: 40 mg via INTRAVENOUS

## 2022-08-25 MED ORDER — PHENYLEPHRINE HCL (PRESSORS) 10 MG/ML IV SOLN
INTRAVENOUS | Status: DC | PRN
  Administered 2022-08-25: 100 ug via INTRAVENOUS

## 2022-08-25 MED ORDER — DEXAMETHASONE SODIUM PHOSPHATE 10 MG/ML IJ SOLN
INTRAMUSCULAR | Status: AC
  Filled 2022-08-25: qty 1

## 2022-08-25 MED ORDER — PHENYLEPHRINE HCL-NACL 20-0.9 MG/250ML-% IV SOLN
INTRAVENOUS | Status: DC | PRN
  Administered 2022-08-25: 30 ug/min via INTRAVENOUS

## 2022-08-25 MED ORDER — ACETAMINOPHEN 325 MG PO TABS
325.0000 mg | ORAL_TABLET | Freq: Four times a day (QID) | ORAL | Status: DC | PRN
  Administered 2022-08-26: 650 mg via ORAL
  Filled 2022-08-25: qty 2

## 2022-08-25 MED ORDER — CEFAZOLIN SODIUM-DEXTROSE 2-4 GM/100ML-% IV SOLN
2.0000 g | INTRAVENOUS | Status: AC
  Administered 2022-08-25: 2 g via INTRAVENOUS
  Filled 2022-08-25: qty 100

## 2022-08-25 MED ORDER — LACTATED RINGERS IV SOLN: INTRAVENOUS | Status: DC

## 2022-08-25 MED ORDER — ONDANSETRON HCL 4 MG PO TABS: 4.0000 mg | ORAL_TABLET | Freq: Four times a day (QID) | ORAL | Status: DC | PRN

## 2022-08-25 MED ORDER — HYDROMORPHONE HCL 1 MG/ML IJ SOLN: 0.5000 mg | INTRAMUSCULAR | Status: DC | PRN

## 2022-08-25 MED ORDER — METHOCARBAMOL 1000 MG/10ML IJ SOLN: 500.0000 mg | Freq: Four times a day (QID) | INTRAVENOUS | Status: DC | PRN

## 2022-08-25 MED ORDER — PHENOL 1.4 % MT LIQD: 1.0000 | OROMUCOSAL | Status: DC | PRN

## 2022-08-25 MED ORDER — ORAL CARE MOUTH RINSE: 15.0000 mL | OROMUCOSAL | Status: DC | PRN

## 2022-08-25 MED ORDER — ADULT MULTIVITAMIN W/MINERALS CH
1.0000 | ORAL_TABLET | Freq: Every day | ORAL | Status: DC
  Administered 2022-08-26 – 2022-08-28 (×3): 1 via ORAL
  Filled 2022-08-25 (×3): qty 1

## 2022-08-25 MED ORDER — HYDROMORPHONE HCL 1 MG/ML IJ SOLN: 0.2500 mg | INTRAMUSCULAR | Status: DC | PRN

## 2022-08-25 MED ORDER — METOCLOPRAMIDE HCL 5 MG PO TABS: 5.0000 mg | ORAL_TABLET | Freq: Three times a day (TID) | ORAL | Status: DC | PRN

## 2022-08-25 MED ORDER — SODIUM CHLORIDE 0.9 % IV SOLN: INTRAVENOUS | Status: DC

## 2022-08-25 MED ORDER — PROPOFOL 500 MG/50ML IV EMUL
INTRAVENOUS | Status: DC | PRN
  Administered 2022-08-25: 50 ug/kg/min via INTRAVENOUS

## 2022-08-25 MED ORDER — FENTANYL CITRATE (PF) 100 MCG/2ML IJ SOLN
INTRAMUSCULAR | Status: DC | PRN
  Administered 2022-08-25 (×2): 50 ug via INTRAVENOUS

## 2022-08-25 MED ORDER — ALUM & MAG HYDROXIDE-SIMETH 200-200-20 MG/5ML PO SUSP: 30.0000 mL | ORAL | Status: DC | PRN

## 2022-08-25 MED ORDER — DIPHENHYDRAMINE HCL 12.5 MG/5ML PO ELIX: 12.5000 mg | ORAL_SOLUTION | ORAL | Status: DC | PRN

## 2022-08-25 MED ORDER — DEXMEDETOMIDINE HCL IN NACL 80 MCG/20ML IV SOLN
INTRAVENOUS | Status: DC | PRN
  Administered 2022-08-25 (×2): 4 ug via BUCCAL

## 2022-08-25 MED ORDER — MENTHOL 3 MG MT LOZG: 1.0000 | LOZENGE | OROMUCOSAL | Status: DC | PRN

## 2022-08-25 MED ORDER — SIMVASTATIN 40 MG PO TABS
40.0000 mg | ORAL_TABLET | Freq: Every day | ORAL | Status: DC
  Administered 2022-08-25 – 2022-08-27 (×3): 40 mg via ORAL
  Filled 2022-08-25 (×3): qty 1

## 2022-08-25 MED ORDER — ACETAMINOPHEN 10 MG/ML IV SOLN: 1000.0000 mg | Freq: Once | INTRAVENOUS | Status: DC | PRN

## 2022-08-25 MED ORDER — ONDANSETRON HCL 4 MG/2ML IJ SOLN
INTRAMUSCULAR | Status: AC
  Filled 2022-08-25: qty 2

## 2022-08-25 MED ORDER — PHENYLEPHRINE 80 MCG/ML (10ML) SYRINGE FOR IV PUSH (FOR BLOOD PRESSURE SUPPORT)
PREFILLED_SYRINGE | INTRAVENOUS | Status: AC
  Filled 2022-08-25: qty 10

## 2022-08-25 MED ORDER — ASPIRIN 81 MG PO CHEW
81.0000 mg | CHEWABLE_TABLET | Freq: Two times a day (BID) | ORAL | Status: DC
  Administered 2022-08-25 – 2022-08-28 (×6): 81 mg via ORAL
  Filled 2022-08-25 (×6): qty 1

## 2022-08-25 MED ORDER — ONDANSETRON HCL 4 MG/2ML IJ SOLN: 4.0000 mg | Freq: Once | INTRAMUSCULAR | Status: DC | PRN

## 2022-08-25 MED ORDER — METHOCARBAMOL 500 MG PO TABS
500.0000 mg | ORAL_TABLET | Freq: Four times a day (QID) | ORAL | Status: DC | PRN
  Administered 2022-08-25 – 2022-08-26 (×4): 500 mg via ORAL
  Filled 2022-08-25 (×4): qty 1

## 2022-08-25 MED ORDER — BUPIVACAINE IN DEXTROSE 0.75-8.25 % IT SOLN
INTRATHECAL | Status: DC | PRN
  Administered 2022-08-25: 1.6 mL via INTRATHECAL

## 2022-08-25 MED ORDER — ONDANSETRON HCL 4 MG/2ML IJ SOLN
4.0000 mg | Freq: Four times a day (QID) | INTRAMUSCULAR | Status: DC | PRN
  Administered 2022-08-27: 4 mg via INTRAVENOUS
  Filled 2022-08-25: qty 2

## 2022-08-25 SURGICAL SUPPLY — 57 items
BAG SPEC THK2 15X12 ZIP CLS (MISCELLANEOUS) ×1
BAG ZIPLOCK 12X15 (MISCELLANEOUS) ×1 IMPLANT
BLADE SAG 18X100X1.27 (BLADE) ×1 IMPLANT
BLADE SURG SZ10 CARB STEEL (BLADE) ×4 IMPLANT
BNDG ELASTIC 4X5.8 VLCR STR LF (GAUZE/BANDAGES/DRESSINGS) IMPLANT
BOWL SMART MIX CTS (DISPOSABLE) IMPLANT
BSPLAT TIB 5D E CMNT STM LT (Knees) ×1 IMPLANT
CATH FOLEY 2WAY 5CC 16FR (CATHETERS) ×1
CATH URTH STD 16FR FL 2W DRN (CATHETERS) IMPLANT
CEMENT BONE R 1X40 (Cement) IMPLANT
CEMENT BONE SIMPLEX SPEEDSET (Cement) IMPLANT
COMP PATELLAR 35 STD 9 THK (Orthopedic Implant) IMPLANT
COOLER ICEMAN CLASSIC (MISCELLANEOUS) ×1 IMPLANT
COVER SURGICAL LIGHT HANDLE (MISCELLANEOUS) ×2 IMPLANT
CUFF TOURN SGL QUICK 34 (TOURNIQUET CUFF) ×1
CUFF TRNQT CYL 34X4.125X (TOURNIQUET CUFF) ×1 IMPLANT
DRAPE INCISE IOBAN 66X45 STRL (DRAPES) ×2 IMPLANT
DRAPE U-SHAPE 47X51 STRL (DRAPES) ×1 IMPLANT
DURAPREP 26ML APPLICATOR (WOUND CARE) ×2 IMPLANT
ELECT REM PT RETURN 15FT ADLT (MISCELLANEOUS) ×2 IMPLANT
FEMORAL KNEE COMP SZ 8 STND LT (Knees) ×1 IMPLANT
FEMORAL KNEE COMP SZ 8STD LT (Knees) IMPLANT
GAUZE PAD ABD 8X10 STRL (GAUZE/BANDAGES/DRESSINGS) ×2 IMPLANT
GAUZE SPONGE 4X4 12PLY STRL (GAUZE/BANDAGES/DRESSINGS) ×2 IMPLANT
GAUZE XEROFORM 1X8 LF (GAUZE/BANDAGES/DRESSINGS) IMPLANT
GLOVE BIO SURGEON STRL SZ7.5 (GLOVE) ×1 IMPLANT
GLOVE BIOGEL PI IND STRL 8 (GLOVE) ×2 IMPLANT
GLOVE ECLIPSE 8.0 STRL XLNG CF (GLOVE) ×1 IMPLANT
GOWN STRL REUS W/ TWL XL LVL3 (GOWN DISPOSABLE) ×2 IMPLANT
GOWN STRL REUS W/TWL XL LVL3 (GOWN DISPOSABLE) ×2
HANDPIECE INTERPULSE COAX TIP (DISPOSABLE) ×1
HDLS TROCR DRIL PIN KNEE 75 (PIN) ×1
HOLDER FOLEY CATH W/STRAP (MISCELLANEOUS) IMPLANT
IMMOBILIZER KNEE 20 (SOFTGOODS) ×1
IMMOBILIZER KNEE 20 THIGH 36 (SOFTGOODS) ×1 IMPLANT
KIT TURNOVER KIT A (KITS) IMPLANT
NS IRRIG 1000ML POUR BTL (IV SOLUTION) ×2 IMPLANT
PACK TOTAL KNEE CUSTOM (KITS) ×1 IMPLANT
PAD COLD SHLDR WRAP-ON (PAD) ×2 IMPLANT
PADDING CAST COTTON 6X4 STRL (CAST SUPPLIES) ×2 IMPLANT
PATELLA ZIMMER 35MM (Orthopedic Implant) ×1 IMPLANT
PIN DRILL HDLS TROCAR 75 4PK (PIN) IMPLANT
PROTECTOR NERVE ULNAR (MISCELLANEOUS) ×2 IMPLANT
SCREW FEMALE HEX FIX 25X2.5 (ORTHOPEDIC DISPOSABLE SUPPLIES) IMPLANT
SET HNDPC FAN SPRY TIP SCT (DISPOSABLE) ×1 IMPLANT
SET PAD KNEE POSITIONER (MISCELLANEOUS) ×1 IMPLANT
STAPLER VISISTAT 35W (STAPLE) IMPLANT
STEM ARTISURF EF 12 SZ8-11 (Stem) IMPLANT
STEM TIBIA 5 DEG SZ E L KNEE (Knees) IMPLANT
SUT VIC AB 0 CT1 27 (SUTURE) ×1
SUT VIC AB 0 CT1 27XBRD ANTBC (SUTURE) ×1 IMPLANT
SUT VIC AB 1 CT1 36 (SUTURE) ×2 IMPLANT
SUT VIC AB 2-0 CT1 27 (SUTURE) ×2
SUT VIC AB 2-0 CT1 TAPERPNT 27 (SUTURE) ×2 IMPLANT
TIBIA STEM 5 DEG SZ E L KNEE (Knees) ×1 IMPLANT
TRAY FOLEY MTR SLVR 16FR STAT (SET/KITS/TRAYS/PACK) IMPLANT
WATER STERILE IRR 1000ML POUR (IV SOLUTION) ×2 IMPLANT

## 2022-08-25 NOTE — Anesthesia Postprocedure Evaluation (Signed)
Anesthesia Post Note  Patient: Robert [name]  Procedure(s) Performed: LEFT TOTAL KNEE ARTHROPLASTY (Left: Knee)     Patient location during evaluation: PACU Anesthesia Type: Spinal Level of consciousness: oriented and awake and alert Pain management: pain level controlled Vital Signs Assessment: post-procedure vital signs reviewed and stable Respiratory status: spontaneous breathing, respiratory function stable and patient connected to nasal cannula oxygen Cardiovascular status: blood pressure returned to baseline and stable Postop Assessment: no headache, no backache and no apparent nausea or vomiting Anesthetic complications: no  No notable events documented.  Last Vitals:  Vitals:   08/25/22 1130 08/25/22 1145  BP: 98/67 106/82  Pulse: 79 81  Resp: 18 15  Temp:  36.4 C  SpO2: (!) 89% 98%    Last Pain:  Vitals:   08/25/22 1130  TempSrc:   PainSc: 0-No pain                 Fredericka Bottcher S

## 2022-08-25 NOTE — Transfer of Care (Signed)
Immediate Anesthesia Transfer of Care Note  Patient: Robert [name]  Procedure(s) Performed: LEFT TOTAL KNEE ARTHROPLASTY (Left: Knee)  Patient Location: PACU  Anesthesia Type:Spinal  Level of Consciousness: awake, alert , oriented, and patient cooperative  Airway & Oxygen Therapy: Patient Spontanous Breathing and Patient connected to face mask oxygen  Post-op Assessment: Report given to RN and Post -op Vital signs reviewed and stable  Post vital signs: Reviewed and stable  Last Vitals:  Vitals Value Taken Time  BP 106/66 08/25/22 1037  Temp    Pulse 85 08/25/22 1039  Resp 13 08/25/22 1039  SpO2 99 % 08/25/22 1039  Vitals shown include unvalidated device data.  Last Pain:  Vitals:   08/25/22 0634  TempSrc:   PainSc: 0-No pain      Patients Stated Pain Goal: 3 ([id number])  Complications: No notable events documented.

## 2022-08-25 NOTE — Anesthesia Procedure Notes (Signed)
Anesthesia Regional Block: Adductor canal block   Pre-Anesthetic Checklist: , timeout performed,  Correct Patient, Correct Site, Correct Laterality,  Correct Procedure, Correct Position, site marked,  Risks and benefits discussed,  Surgical consent,  Pre-op evaluation,  At surgeon's request and post-op pain management  Laterality: Left  Prep: chloraprep       Needles:  Injection technique: Single-shot  Needle Type: Echogenic Needle     Needle Length: 9cm      Additional Needles:   Procedures:,,,, ultrasound used (permanent image in chart),,    Narrative:  Start time: 08/25/2022 8:17 AM End time: 08/25/2022 8:24 AM Injection made incrementally with aspirations every 5 mL.  Performed by: Personally  Anesthesiologist: Myrtie Soman, MD  Additional Notes: Patient tolerated the procedure well without complications

## 2022-08-25 NOTE — Anesthesia Procedure Notes (Signed)
Spinal  Patient location during procedure: OR Start time: 08/25/2022 8:41 AM End time: 08/25/2022 8:46 AM Reason for block: surgical anesthesia Staffing Performed: resident/CRNA  Resident/CRNA: Garrel Ridgel, CRNA Performed by: Garrel Ridgel, CRNA Authorized by: Myrtie Soman, MD   Preanesthetic Checklist Completed: patient identified, IV checked, site marked, risks and benefits discussed, surgical consent, monitors and equipment checked, pre-op evaluation and timeout performed Spinal Block Patient position: sitting Prep: DuraPrep Patient monitoring: heart rate, cardiac monitor, continuous pulse ox and blood pressure Approach: midline Location: L4-5 Injection technique: single-shot Needle Needle type: Pencan  Needle gauge: 24 G Needle length: 9 cm Needle insertion depth: 6 cm Assessment Sensory level: T10 Events: CSF return

## 2022-08-25 NOTE — Op Note (Signed)
Operative Note  Date of operation: 08/25/2022 Preoperative diagnosis: Left knee primary osteoarthritis Postoperative diagnosis: Same  Procedure: Left cemented primary total knee arthroplasty  Implants: Biomet/Zimmer persona knee system with size 8 standard CR left femur, size E left tibial tray, 12 mm thickness left medial congruent polythene insert, 35 mm patella button  Surgeon: Lind Guest. Ninfa Linden, MD Assistant: Benita Stabile, PA-C  Anesthesia: #1 left lower extremity adductor canal block, #2 spinal, #3 local Antibiotics: IV Ancef Tourniquet time: Less than 1 hour EBL: Less than [id number] cc Complications: None  Indications: The patient is an 82 year old gentleman with debilitating arthritis involving his left knee that has been well-documented.  He has been dealing with this for some time now and has tried and failed all forms of conservative treatment.  His left knee pain is daily and it is detrimentally affecting his mobility, his quality of life and his actives daily living to the point that he does wish to proceed with a total knee arthroplasty and we agree with this as well.  We talked in length in detail about the risk of acute blood loss anemia, nerve or vessel injury, fracture, infection, implant failure, DVT and skin and soft tissue wound healing issues.  We talked about her goals being hopefully decrease pain, improve mobility, and improve quality of life.  Procedure description: After informed consent was obtained and the appropriate left knee was marked, an adductor canal block was obtained to the left lower extremity the holding room and the patient was then brought to the operating room and set up on the operating table where spinal anesthesia was obtained.  He was then laid in supine position on the operating table and a Foley catheter was placed.  A nonsterile tourniquet is placed around his upper left thigh and his left thigh, knee, leg and ankle were prepped and draped with  DuraPrep and sterile drapes including a sterile stockinette.  A timeout was called and he was identified as the correct patient and the correct left knee.  An Esmarch was used to wrap out the leg and the tourniquet was plated to 300 mm of pressure.  With the knee extended I then made a direct midline incision over the patella and carried this proximally distally.  Dissection was carried down to the knee joint and a medial parapatellar arthrotomy was made.  There was a moderate joint effusion encountered.  With the knee in a flexed position we found significant cartilage loss in the knee.  Remnants of the ACL and medial lateral meniscus as well as osteophytes removed from all 3 compartments.  Using an extramedullary cutting guide we then made our proximal tibia cut correction varus valgus at 3 degree slope with this cut made to take 2 mm off of the low side.  We then used a intramedullary cutting guide for distal femoral cutting guide setting this for a left knee at 5 degrees externally rotated and 10 mm distal femoral cut.  We made that cut without difficulty and brought the knee back down to full extension and cleaned more debris from the back of the knee.  A 10 mm extension block was still tight so we ended up taking 2 additional millimeters off of the tibia.  We then used a femoral sizing guide based off the epicondylar axis with the knee in a flexed position and chose a size 8 femur.  We put a 4-in-1 cutting block for size 8 femur and made her anterior posterior cuts followed our chamfer  cuts.  We then backed the tibia and chose a size E left tibial tray for coverage off of the tibial plateau setting the rotation with the tibial tubercle and the femur.  We then did our keel punch and drill hole off of this.  We then trialed our size left E tibia with a size 8 left CR standard femur.  We went up to a 12 mm medial congruent polythene insert we felt that that offered just good stability and range of motion.  We  then made a patella cut and drilled 3 holes for size 35 patella button.  Again we put the knee through several cycles of motion with all trials rotation of the knee.  We then removed all trial instrumentation and irrigate the knee with normal saline solution.  We then placed our Marcaine with epinephrine around the arthrotomy.  The cement was mixed in with the knee in a flexed position we cemented our Biomet Zimmer persona tibial tray for left knee size EE followed by our size 8 left CR standard femur.  We placed our 12 mm medial congruent left fixed-bearing polythene insert and cemented our size 35 patella button.  Once the cemented hardened we let the tourniquet down hemostasis was obtained electrocautery.  The arthrotomy was closed with interrupted #1 Vicryl suture followed by 0 Vicryl close the deep tissue and 2-0 Vicryl close subcutaneous tissue.  The skin was closed with staples.  Well-padded sterile dressing was applied and the patient was taken off of the operating table and taken to recovery in stable addition with all final counts being correct and no complications noted.  Benita Stabile, PA-C did assisted in the entire case and assistance was crucial and medically necessary for soft tissue retraction and management, helping guide implant placement and a layered closure of the wound.

## 2022-08-25 NOTE — Interval H&P Note (Signed)
History and Physical Interval Note: The patient understands that he is here today for a left knee replacement to treat his severe left knee arthritis.  There has been no acute or interval change in his medical status.  See H&P.  The risks and benefits of surgery have been explained in detail and informed consent is obtained.  The left operative knee has been marked.  08/25/2022 6:55 AM  Robert [name]  has presented today for surgery, with the diagnosis of osteoarthritis left knee.  The various methods of treatment have been discussed with the patient and family. After consideration of risks, benefits and other options for treatment, the patient has consented to  Procedure(s): LEFT TOTAL KNEE ARTHROPLASTY (Left) as a surgical intervention.  The patient's history has been reviewed, patient examined, no change in status, stable for surgery.  I have reviewed the patient's chart and labs.  Questions were answered to the patient's satisfaction.     Mcarthur Rossetti

## 2022-08-25 NOTE — TOC Transition Note (Signed)
Transition of Care [facility]) - CM/SW Discharge Note   Patient Details  Name: Robert [name] MRN: [id number] Date of Birth: September 06, 1940  Transition of Care [facility]) CM/SW Contact:  Lennart Pall, LCSW Phone Number: 08/25/2022, 2:20 PM   Clinical Narrative:    Met with pt and daughter who confirm he has needed DME at home.  HHPT prearranged with [facility].  No further TOC needs.   Final next level of care: [place] Barriers to Discharge: No Barriers Identified   Patient Goals and CMS Choice      Discharge Placement                         Discharge Plan and Services Additional resources added to the After Visit Summary for                  DME Arranged: N/A DME Agency: NA       HH Arranged: PT HH Agency: [facility]        Social Determinants of Health (SDOH) Interventions SDOH Screenings   Food Insecurity: No Food Insecurity (08/25/2022)  Housing: Low Risk  (08/25/2022)  Transportation Needs: No Transportation Needs (08/25/2022)  Utilities: Not At Risk (08/25/2022)  Tobacco Use: Low Risk  (08/25/2022)     Readmission Risk Interventions     No data to display

## 2022-08-25 NOTE — Anesthesia Procedure Notes (Signed)
Anesthesia Procedure Image    

## 2022-08-25 NOTE — Evaluation (Signed)
Physical Therapy Evaluation Patient Details Name: Robert [name] MRN: [id number] DOB: 02-26-1941 Today's Date: 08/25/2022  History of Present Illness  82 yo male presents to therapy s/p L TKA on 08/25/2022 due to failure of conservative measures. Pt PMH includes but is not limited to prostate ca s/p radiation threapy, diverticulosis, HDL, and HTN.  Clinical Impression  Robert [name] is a 82 y.o. male POD 0 s/p L TKA. Patient reports IND with mobility at baseline. Patient is now limited by functional impairments (see PT problem list below) and requires min guard for supine to sit and max A x 2 for sit to supine due to LOC episode with Bp decreasing to 91/62 once returned to supine  and min guard for STS from elevated EOB only. Patient will benefit from continued skilled PT interventions to address impairments and progress towards PLOF. Acute PT will follow to progress mobility and stair training in preparation for safe discharge home.        Recommendations for follow up therapy are one component of a multi-disciplinary discharge planning process, led by the attending physician.  Recommendations may be updated based on patient status, additional functional criteria and insurance authorization.  Follow Up Recommendations Follow physician's recommendations for discharge plan and follow up therapies      Assistance Recommended at Discharge Frequent or constant Supervision/Assistance  Patient can return home with the following  A lot of help with walking and/or transfers;Assistance with cooking/housework;A lot of help with bathing/dressing/bathroom;Assist for transportation;Help with stairs or ramp for entrance    Equipment Recommendations Other (comment) (pt reports having DME in home setting)  Recommendations for Other Services       Functional Status Assessment Patient has had a recent decline in their functional status and demonstrates the ability to make significant improvements in function in a  reasonable and predictable amount of time.     Precautions / Restrictions Precautions Precautions: Fall, orthostatic hypotension  Restrictions Weight Bearing Restrictions: No Other Position/Activity Restrictions: LLE WBAT      Mobility  Bed Mobility Overal bed mobility: Needs Assistance Bed Mobility: Supine to Sit, Sit to Supine     Supine to sit: Min guard (with HOB slightly elevated) Sit to supine: +2 for physical assistance, Max assist (due to LOC)        Transfers Overall transfer level: Needs assistance Equipment used: Rolling walker (2 wheels) Transfers: Sit to/from Stand Sit to Stand: Min guard (with cues for proper UE and LE placement)                Ambulation/Gait                  Stairs            Wheelchair Mobility    Modified Rankin (Stroke Patients Only)       Balance Overall balance assessment: Needs assistance Sitting-balance support: Bilateral upper extremity supported, Feet supported Sitting balance-Leahy Scale: Fair     Standing balance support: Reliant on assistive device for balance Standing balance-Leahy Scale: Poor                               Pertinent Vitals/Pain Pain Assessment Pain Assessment: 0-10 Pain Score: 3  Pain Location: L knee    Home Living Family/patient expects to be discharged to:: Private residence Living Arrangements: Spouse/significant other Available Help at Discharge: Family;Other (Comment);Available 24 hours/day (daughter and son and law wil remain with  pt due to pt primary caregiver for wife whom requires 24/4 S and A) Type of Home: House Home Access: Ramped entrance (from the garage)       Home Layout: One level Home Equipment: Conservation officer, nature (2 wheels);Cane - single point;Shower seat;Grab bars - tub/shower      Prior Function Prior Level of Function : Independent/Modified Independent             Mobility Comments: pt reports I with ADLs, self care tasks,  driving, IADLs no AD in home or community       Hand Dominance        Extremity/Trunk Assessment        Lower Extremity Assessment Lower Extremity Assessment: LLE deficits/detail LLE Deficits / Details: ankle DF/PF 5/5, hip flexion 4-/5 LLE Sensation: decreased light touch       Communication   Communication: No difficulties;HOH  Cognition Arousal/Alertness: Awake/alert Behavior During Therapy: WFL for tasks assessed/performed Overall Cognitive Status: Within Functional Limits for tasks assessed                                          General Comments      Exercises     Assessment/Plan    PT Assessment Patient needs continued PT services  PT Problem List Decreased strength;Decreased range of motion;Decreased activity tolerance;Decreased balance;Decreased mobility;Decreased knowledge of use of DME;Impaired sensation;Pain       PT Treatment Interventions DME instruction;Gait training;Stair training;Therapeutic activities;Functional mobility training;Therapeutic exercise;Balance training;Neuromuscular re-education;Modalities    PT Goals (Current goals can be found in the Care Plan section)  Acute Rehab PT Goals Patient Stated Goal: to get stronger and go home PT Goal Formulation: With patient/family Time For Goal Achievement: 09/08/22 Potential to Achieve Goals: Good    Frequency 7X/week     Co-evaluation               AM-PAC PT "6 Clicks" Mobility  Outcome Measure Help needed turning from your back to your side while in a flat bed without using bedrails?: A Little Help needed moving from lying on your back to sitting on the side of a flat bed without using bedrails?: A Little Help needed moving to and from a bed to a chair (including a wheelchair)?: A Lot Help needed standing up from a chair using your arms (e.g., wheelchair or bedside chair)?: A Little Help needed to walk in hospital room?: Total Help needed climbing 3-5 steps with  a railing? : Total 6 Click Score: 13    End of Session Equipment Utilized During Treatment: Gait belt Activity Tolerance: Patient limited by fatigue;Treatment limited secondary to medical complications (Comment) (LOC once returned to EOB from standing at RW and progressing to weight shifting  pt unsafe to amb at eval) Patient left: in bed;with call bell/phone within reach;with bed alarm set;with family/visitor present Nurse Communication: Mobility status;Other (comment) (pt response with level changes) PT Visit Diagnosis: Unsteadiness on feet (R26.81);Other abnormalities of gait and mobility (R26.89);Muscle weakness (generalized) (M62.81);Pain;Difficulty in walking, not elsewhere classified (R26.2) Pain - Right/Left: Left Pain - part of body: Knee    Time: [id number] PT Time Calculation (min) (ACUTE ONLY): 42 min   Charges:   PT Evaluation $PT Eval Low Complexity: 1 Low PT Treatments $Therapeutic Activity: 23-37 mins        Baird Lyons, PT   Adair Patter 08/25/2022, 3:53 PM

## 2022-08-25 NOTE — Anesthesia Preprocedure Evaluation (Signed)
Anesthesia Evaluation  Patient identified by MRN, date of birth, ID band Patient awake    Reviewed: Allergy & Precautions, H&P , NPO status , Patient's Chart, lab work & pertinent test results  Airway Mallampati: II  TM Distance: >3 FB Neck ROM: Full    Dental no notable dental hx.    Pulmonary neg pulmonary ROS   Pulmonary exam normal breath sounds clear to auscultation       Cardiovascular hypertension, Normal cardiovascular exam Rhythm:Regular Rate:Normal     Neuro/Psych negative neurological ROS  negative psych ROS   GI/Hepatic negative GI ROS, Neg liver ROS,,,  Endo/Other  negative endocrine ROS    Renal/GU negative Renal ROS  negative genitourinary   Musculoskeletal  (+) Arthritis , Osteoarthritis,    Abdominal   Peds negative pediatric ROS (+)  Hematology negative hematology ROS (+)   Anesthesia Other Findings   Reproductive/Obstetrics negative OB ROS                             Anesthesia Physical Anesthesia Plan  ASA: 2  Anesthesia Plan: Spinal   Post-op Pain Management: Regional block*   Induction: Intravenous  PONV Risk Score and Plan: 1 and Propofol infusion, Treatment may vary due to age or medical condition and Ondansetron  Airway Management Planned: Simple Face Mask  Additional Equipment:   Intra-op Plan:   Post-operative Plan:   Informed Consent: I have reviewed the patients History and Physical, chart, labs and discussed the procedure including the risks, benefits and alternatives for the proposed anesthesia with the patient or authorized representative who has indicated his/her understanding and acceptance.     Dental advisory given  Plan Discussed with: CRNA and Surgeon  Anesthesia Plan Comments:        Anesthesia Quick Evaluation

## 2022-08-26 DIAGNOSIS — Z79899 Other long term (current) drug therapy: Secondary | ICD-10-CM | POA: Diagnosis not present

## 2022-08-26 DIAGNOSIS — I10 Essential (primary) hypertension: Secondary | ICD-10-CM | POA: Diagnosis not present

## 2022-08-26 DIAGNOSIS — Z8546 Personal history of malignant neoplasm of prostate: Secondary | ICD-10-CM | POA: Diagnosis not present

## 2022-08-26 DIAGNOSIS — Z7982 Long term (current) use of aspirin: Secondary | ICD-10-CM | POA: Diagnosis not present

## 2022-08-26 DIAGNOSIS — M1712 Unilateral primary osteoarthritis, left knee: Secondary | ICD-10-CM | POA: Diagnosis not present

## 2022-08-26 LAB — CBC
HCT: 39.7 % (ref 39.0–52.0)
Hemoglobin: 13.4 g/dL (ref 13.0–17.0)
MCH: 31.1 pg (ref 26.0–34.0)
MCHC: 33.8 g/dL (ref 30.0–36.0)
MCV: 92.1 fL (ref 80.0–100.0)
Platelets: 245 10*3/uL (ref 150–400)
RBC: 4.31 MIL/uL (ref 4.22–5.81)
RDW: 12.9 % (ref 11.5–15.5)
WBC: 14.9 10*3/uL — ABNORMAL HIGH (ref 4.0–10.5)
nRBC: 0 % (ref 0.0–0.2)

## 2022-08-26 LAB — BASIC METABOLIC PANEL
Anion gap: 14 (ref 5–15)
BUN: 20 mg/dL (ref 8–23)
CO2: 22 mmol/L (ref 22–32)
Calcium: 8.3 mg/dL — ABNORMAL LOW (ref 8.9–10.3)
Chloride: 102 mmol/L (ref 98–111)
Creatinine, Ser: 1.18 mg/dL (ref 0.61–1.24)
GFR, Estimated: >60 mL/min (ref >60)
Glucose, Bld: 141 mg/dL — ABNORMAL HIGH (ref 70–99)
Potassium: 3.7 mmol/L (ref 3.5–5.1)
Sodium: 138 mmol/L (ref 135–145)

## 2022-08-26 MED ORDER — ASPIRIN 81 MG PO CHEW: 81.0000 mg | CHEWABLE_TABLET | Freq: Two times a day (BID) | ORAL | 0 refills | Status: AC

## 2022-08-26 MED ORDER — METHOCARBAMOL 500 MG PO TABS: 500.0000 mg | ORAL_TABLET | Freq: Four times a day (QID) | ORAL | 1 refills | Status: AC | PRN

## 2022-08-26 MED ORDER — OXYCODONE HCL 5 MG PO TABS: 5.0000 mg | ORAL_TABLET | ORAL | 0 refills | Status: AC | PRN

## 2022-08-26 NOTE — Plan of Care (Signed)
  Problem: Education: Goal: Knowledge of the prescribed therapeutic regimen will improve Outcome: Progressing   Problem: Activity: Goal: Ability to avoid complications of mobility impairment will improve Outcome: Progressing   Problem: Pain Management: Goal: Pain level will decrease with appropriate interventions Outcome: Progressing   

## 2022-08-26 NOTE — Discharge Instructions (Signed)

## 2022-08-26 NOTE — Progress Notes (Signed)
Physical Therapy Treatment Patient Details Name: Robert [name] MRN: [id number] DOB: 1941-04-13 Today's Date: 08/26/2022   History of Present Illness 82 yo male presents to therapy s/p L TKA on 08/25/2022 due to failure of conservative measures. Pt PMH includes but is not limited to prostate ca s/p radiation threapy, diverticulosis, HDL, and HTN.    PT Comments    Pt progressing this session however is  unsteady with gait requiring min assist at times to prevent overt LOB, fatigues easily. Pt will likely need another day to address safety with mobility.     Recommendations for follow up therapy are one component of a multi-disciplinary discharge planning process, led by the attending physician.  Recommendations may be updated based on patient status, additional functional criteria and insurance authorization.  Follow Up Recommendations  Follow physician's recommendations for discharge plan and follow up therapies     Assistance Recommended at Discharge Frequent or constant Supervision/Assistance  Patient can return home with the following A lot of help with walking and/or transfers;Assistance with cooking/housework;A lot of help with bathing/dressing/bathroom;Assist for transportation;Help with stairs or ramp for entrance   Equipment Recommendations  Other (comment) (pt reports having DME in home setting)    Recommendations for Other Services       Precautions / Restrictions Precautions Precautions: Fall;Knee Restrictions Weight Bearing Restrictions: No Other Position/Activity Restrictions: LLE WBAT     Mobility  Bed Mobility Overal bed mobility: Needs Assistance Bed Mobility: Supine to Sit     Supine to sit: Min assist (with HOB slightly elevated) Sit to supine:  (due to LOC)   General bed mobility comments: assist to bring LLE off bed, pt able to initiate use dog gait belt as leg lifter    Transfers Overall transfer level: Needs assistance Equipment used: Rolling walker  (2 wheels) Transfers: Sit to/from Stand Sit to Stand: Min guard, Min assist           General transfer comment: verbal cues for hand placement and LLE position    Ambulation/Gait Ambulation/Gait assistance: Min assist, Min guard Gait Distance (Feet): 45 Feet Assistive device: Rolling walker (2 wheels) Gait Pattern/deviations: Step-to pattern, Decreased stance time - left       General Gait Details: verbal cues for sequence, use of UEs and RW position from self   Stairs             Wheelchair Mobility    Modified Rankin (Stroke Patients Only)       Balance Overall balance assessment: Needs assistance Sitting-balance support: Bilateral upper extremity supported, Feet supported Sitting balance-Leahy Scale: Fair     Standing balance support: Reliant on assistive device for balance Standing balance-Leahy Scale: Poor                              Cognition Arousal/Alertness: Awake/alert Behavior During Therapy: WFL for tasks assessed/performed Overall Cognitive Status: Within Functional Limits for tasks assessed                                          Exercises      General Comments        Pertinent Vitals/Pain Pain Assessment Pain Assessment: 0-10 Pain Score: 6  Pain Location: L knee Pain Descriptors / Indicators: Aching, Grimacing, Discomfort, Guarding Pain Intervention(s): Limited activity within patient's tolerance, Monitored during session, Premedicated before  session    Home Living                          Prior Function            PT Goals (current goals can now be found in the care plan section) Acute Rehab PT Goals Patient Stated Goal: to get stronger and go home PT Goal Formulation: With patient/family Time For Goal Achievement: 09/08/22 Potential to Achieve Goals: Good    Frequency    7X/week      PT Plan      Co-evaluation              AM-PAC PT "6 Clicks" Mobility    Outcome Measure  Help needed turning from your back to your side while in a flat bed without using bedrails?: A Little Help needed moving from lying on your back to sitting on the side of a flat bed without using bedrails?: A Little Help needed moving to and from a bed to a chair (including a wheelchair)?: A Lot Help needed standing up from a chair using your arms (e.g., wheelchair or bedside chair)?: A Little Help needed to walk in hospital room?: Total Help needed climbing 3-5 steps with a railing? : Total 6 Click Score: 13    End of Session Equipment Utilized During Treatment: Gait belt Activity Tolerance: Patient limited by fatigue;Treatment limited secondary to medical complications (Comment) (LOC once returned to EOB from standing at RW and progressing to weight shifting  pt unsafe to amb at eval) Patient left: in bed;with call bell/phone within reach;with bed alarm set;with family/visitor present Nurse Communication: Mobility status;Other (comment) (pt response with level changes) PT Visit Diagnosis: Unsteadiness on feet (R26.81);Other abnormalities of gait and mobility (R26.89);Muscle weakness (generalized) (M62.81);Pain;Difficulty in walking, not elsewhere classified (R26.2) Pain - Right/Left: Left Pain - part of body: Knee     Time: [id number] PT Time Calculation (min) (ACUTE ONLY): 21 min  Charges:  $Gait Training: 8-22 mins                     Baxter Flattery, PT  Acute Rehab Dept [facility]) [phone]  [facility] Weekend Pager [facility] only)  [phone]  08/26/2022    [facility] 08/26/2022, 2:01 PM

## 2022-08-26 NOTE — Progress Notes (Signed)
Subjective: 1 Day Post-Op Procedure(s) (LRB): LEFT TOTAL KNEE ARTHROPLASTY (Left) Patient reports pain as moderate.  Working slowly with therapy.  Objective: Vital signs in last 24 hours: Temp:  [97.5 F (36.4 C)-98.3 F (36.8 C)] 97.9 F (36.6 C) (02/17 0929) Pulse Rate:  [80-105] 91 (02/17 0929) Resp:  [15-18] 18 (02/17 0929) BP: (93-133)/(69-97) 133/85 (02/17 0929) SpO2:  [93 %-99 %] 98 % (02/17 0929)  Intake/Output from previous day: 02/16 0701 - 02/17 0700 In: 2555.7 [P.O.:180; I.V.:2175.7; IV Piggyback:200] Out: 1390 [Urine:1340; Blood:50] Intake/Output this shift: No intake/output data recorded.  Recent Labs    08/26/22 0328  HGB 13.4   Recent Labs    08/26/22 0328  WBC 14.9*  RBC 4.31  HCT 39.7  PLT 245   Recent Labs    08/26/22 0328  NA 138  K 3.7  CL 102  CO2 22  BUN 20  CREATININE 1.18  GLUCOSE 141*  CALCIUM 8.3*   No results for input(s): "LABPT", "INR" in the last 72 hours.  Sensation intact distally Intact pulses distally Dorsiflexion/Plantar flexion intact Incision: dressing C/D/I Compartment soft   Assessment/Plan: 1 Day Post-Op Procedure(s) (LRB): LEFT TOTAL KNEE ARTHROPLASTY (Left) Up with therapy Plan for discharge tomorrow Discharge home with home health      Mcarthur Rossetti 08/26/2022, 11:37 AM

## 2022-08-26 NOTE — Progress Notes (Signed)
PT TX NOTE  08/26/22 1542  PT Visit Information  Last PT Received On 08/26/22  Assistance Needed Pt just back to bed with nursing staff ~ 30 minutes prior to PT pm visit;  agreeable to exercises, reviewed ROM, TKA HEP and pt tol well  History of Present Illness 82 yo male presents to therapy s/p L TKA on 08/25/2022 due to failure of conservative measures. Pt PMH includes but is not limited to prostate ca s/p radiation threapy, diverticulosis, HDL, and HTN.  Subjective Data  Patient Stated Goal to get stronger and go home  Precautions  Precautions Fall;Knee  Restrictions  Weight Bearing Restrictions No  Other Position/Activity Restrictions LLE WBAT  Pain Assessment  Pain Assessment 0-10  Pain Score 8  Pain Location L knee  Pain Descriptors / Indicators Aching;Grimacing;Discomfort;Guarding  Pain Intervention(s) Limited activity within patient's tolerance;Monitored during session;Other (comment);Repositioned;Ice applied (declined calling for pain meds)  Cognition  Arousal/Alertness Awake/alert  Behavior During Therapy WFL for tasks assessed/performed  Overall Cognitive Status Within Functional Limits for tasks assessed  General Comments ? mild confusion. pt states "Dr Ninfa Linden gave me a whole cup of medicine"; very unclear timeline of today's events.  reviewed with pt my role, his nurse and nurse tech for today and he verbalized understanding that  Dr Ninfa Linden did not give any meds  Bed Mobility  General bed mobility comments pt just back to bed with nursing staff, fatigued; sleeping soundly  on PT arrival  Total Joint Exercises  Ankle Circles/Pumps AROM;Both;10 reps  Quad Sets AROM;Both;10 reps  Heel Slides AAROM;Left;Other reps (comment) (7 reps, limited by pain and guarding)  Hip ABduction/ADduction AROM;Left;10 reps  Straight Leg Raises Left;10 reps;AAROM  Goniometric ROM ~grossly 15 to 60 degrees AA flexion L knee  PT - End of Session  Activity Tolerance Patient limited by  pain;Patient tolerated treatment well (LOC once returned to EOB from standing at RW and progressing to weight shifting  pt unsafe to amb at eval)  Patient left in bed;with call bell/phone within reach;with bed alarm set;with family/visitor present   PT - Assessment/Plan  PT Plan Current plan remains appropriate  PT Visit Diagnosis Unsteadiness on feet (R26.81);Other abnormalities of gait and mobility (R26.89);Muscle weakness (generalized) (M62.81);Pain;Difficulty in walking, not elsewhere classified (R26.2)  Pain - Right/Left Left  Pain - part of body Knee  PT Frequency (ACUTE ONLY) 7X/week  Follow Up Recommendations Follow physician's recommendations for discharge plan and follow up therapies  Assistance recommended at discharge Frequent or constant Supervision/Assistance  Patient can return home with the following Assist for transportation;Help with stairs or ramp for entrance;A little help with walking and/or transfers;A little help with bathing/dressing/bathroom;Assistance with cooking/housework  PT equipment None recommended by PT  AM-PAC PT "6 Clicks" Mobility Outcome Measure (Version 2)  Help needed turning from your back to your side while in a flat bed without using bedrails? 3  Help needed moving from lying on your back to sitting on the side of a flat bed without using bedrails? 3  Help needed moving to and from a bed to a chair (including a wheelchair)? 3  Help needed standing up from a chair using your arms (e.g., wheelchair or bedside chair)? 3  Help needed to walk in hospital room? 3  Help needed climbing 3-5 steps with a railing?  2  6 Click Score 17  Consider Recommendation of Discharge To: Home with [facility]  PT Goal Progression  Progress towards PT goals Progressing toward goals  Acute Rehab PT Goals  PT Goal Formulation With patient/family  Time For Goal Achievement 09/08/22  Potential to Achieve Goals Good  PT Time Calculation  PT Start Time (ACUTE ONLY) 1447  PT Stop  Time (ACUTE ONLY) 1503  PT Time Calculation (min) (ACUTE ONLY) 16 min  PT General Charges  $$ ACUTE PT VISIT 1 Visit  PT Treatments  $Therapeutic Exercise 8-22 mins

## 2022-08-27 DIAGNOSIS — I10 Essential (primary) hypertension: Secondary | ICD-10-CM | POA: Diagnosis not present

## 2022-08-27 DIAGNOSIS — M1712 Unilateral primary osteoarthritis, left knee: Secondary | ICD-10-CM | POA: Diagnosis not present

## 2022-08-27 DIAGNOSIS — Z8546 Personal history of malignant neoplasm of prostate: Secondary | ICD-10-CM | POA: Diagnosis not present

## 2022-08-27 DIAGNOSIS — Z79899 Other long term (current) drug therapy: Secondary | ICD-10-CM | POA: Diagnosis not present

## 2022-08-27 DIAGNOSIS — Z7982 Long term (current) use of aspirin: Secondary | ICD-10-CM | POA: Diagnosis not present

## 2022-08-27 NOTE — Progress Notes (Signed)
Subjective: 2 Days Post-Op Procedure(s) (LRB): LEFT TOTAL KNEE ARTHROPLASTY (Left) Patient reports pain as mild.  Doing well.  A little apprehensive about going home today.  Objective: Vital signs in last 24 hours: Temp:  [97.8 F (36.6 C)-98.5 F (36.9 C)] 98.5 F (36.9 C) (02/18 0529) Pulse Rate:  [91-106] 106 (02/18 0529) Resp:  [16-18] 16 (02/18 0529) BP: (129-141)/(77-85) 141/77 (02/18 0529) SpO2:  [95 %-98 %] 97 % (02/18 0529)  Intake/Output from previous day: 02/17 0701 - 02/18 0700 In: 982.6 [I.V.:982.6] Out: 1200 [Urine:1200] Intake/Output this shift: No intake/output data recorded.  Recent Labs    08/26/22 0328  HGB 13.4   Recent Labs    08/26/22 0328  WBC 14.9*  RBC 4.31  HCT 39.7  PLT 245   Recent Labs    08/26/22 0328  NA 138  K 3.7  CL 102  CO2 22  BUN 20  CREATININE 1.18  GLUCOSE 141*  CALCIUM 8.3*   No results for input(s): "LABPT", "INR" in the last 72 hours.  Neurologically intact Neurovascular intact Sensation intact distally Intact pulses distally Dorsiflexion/Plantar flexion intact Incision: scant drainage No cellulitis present Compartment soft   Assessment/Plan: 2 Days Post-Op Procedure(s) (LRB): LEFT TOTAL KNEE ARTHROPLASTY (Left) Advance diet Up with therapy D/C IV fluids Discharge home with home health after second PT session as long as cleared by PT and he feels safe WBAT LLE       Aundra Dubin 08/27/2022, 8:57 AM

## 2022-08-27 NOTE — Plan of Care (Signed)
  Problem: Activity: Goal: Ability to avoid complications of mobility impairment will improve Outcome: Progressing   Problem: Activity: Goal: Risk for activity intolerance will decrease Outcome: Progressing   Problem: Pain Managment: Goal: General experience of comfort will improve Outcome: Progressing

## 2022-08-27 NOTE — Progress Notes (Signed)
Physical Therapy Treatment Patient Details Name: Robert [name] MRN: [id number] DOB: 1940-12-15 Today's Date: 08/27/2022   History of Present Illness 82 yo male presents to therapy s/p L TKA on 08/25/2022 due to failure of conservative measures. Pt PMH includes but is not limited to prostate ca s/p radiation threapy, diverticulosis, HDL, and HTN.    PT Comments    Pt progressing, improved gait distance however had LOB x2 in 63' requiring min assist to recover. Needs assist with bed mobility and transfers. Pt may need another day to work with PT to incr ind and safety, will see how he does this afternoon.   Recommendations for follow up therapy are one component of a multi-disciplinary discharge planning process, led by the attending physician.  Recommendations may be updated based on patient status, additional functional criteria and insurance authorization.  Follow Up Recommendations  Follow physician's recommendations for discharge plan and follow up therapies     Assistance Recommended at Discharge Frequent or constant Supervision/Assistance  Patient can return home with the following Assist for transportation;Help with stairs or ramp for entrance;A little help with walking and/or transfers;A little help with bathing/dressing/bathroom;Assistance with cooking/housework   Equipment Recommendations  None recommended by PT    Recommendations for Other Services       Precautions / Restrictions Precautions Precautions: Fall;Knee Restrictions Weight Bearing Restrictions: No Other Position/Activity Restrictions: LLE WBAT     Mobility  Bed Mobility   Bed Mobility: Supine to Sit     Supine to sit: Min assist     General bed mobility comments: assist to elevate trunk and bring LLE off bed, incr time    Transfers Overall transfer level: Needs assistance Equipment used: Rolling walker (2 wheels) Transfers: Sit to/from Stand Sit to Stand: Min assist           General  transfer comment: verbal cues for hand placement and LLE position    Ambulation/Gait Ambulation/Gait assistance: Min assist, Min guard Gait Distance (Feet): 60 Feet Assistive device: Rolling walker (2 wheels) Gait Pattern/deviations: Step-to pattern, Antalgic       General Gait Details: verbal cues for sequence, use of UEs and RW position from self   Stairs             Wheelchair Mobility    Modified Rankin (Stroke Patients Only)       Balance   Sitting-balance support: Bilateral upper extremity supported, Feet supported Sitting balance-Leahy Scale: Fair     Standing balance support: Reliant on assistive device for balance, Bilateral upper extremity supported Standing balance-Leahy Scale: Poor                              Cognition Arousal/Alertness: Awake/alert Behavior During Therapy: WFL for tasks assessed/performed Overall Cognitive Status: Within Functional Limits for tasks assessed                                          Exercises Total Joint Exercises Ankle Circles/Pumps: AROM, Both, 10 reps    General Comments        Pertinent Vitals/Pain Pain Assessment Pain Assessment: 0-10 Pain Location: L knee Pain Descriptors / Indicators: Aching, Grimacing, Discomfort, Guarding Pain Intervention(s): Limited activity within patient's tolerance, Monitored during session, Premedicated before session, Repositioned    Home Living  Prior Function            PT Goals (current goals can now be found in the care plan section) Acute Rehab PT Goals Patient Stated Goal: to get stronger and go home PT Goal Formulation: With patient/family Time For Goal Achievement: 09/08/22 Potential to Achieve Goals: Good Progress towards PT goals: Progressing toward goals    Frequency    7X/week      PT Plan Current plan remains appropriate    Co-evaluation              AM-PAC PT "6 Clicks"  Mobility   Outcome Measure  Help needed turning from your back to your side while in a flat bed without using bedrails?: A Little Help needed moving from lying on your back to sitting on the side of a flat bed without using bedrails?: A Little Help needed moving to and from a bed to a chair (including a wheelchair)?: A Little Help needed standing up from a chair using your arms (e.g., wheelchair or bedside chair)?: A Little Help needed to walk in hospital room?: A Little Help needed climbing 3-5 steps with a railing? : A Little 6 Click Score: 18    End of Session Equipment Utilized During Treatment: Gait belt Activity Tolerance: Patient tolerated treatment well Patient left: with call bell/phone within reach;in chair;with chair alarm set   PT Visit Diagnosis: Unsteadiness on feet (R26.81);Other abnormalities of gait and mobility (R26.89);Muscle weakness (generalized) (M62.81);Pain;Difficulty in walking, not elsewhere classified (R26.2) Pain - Right/Left: Left Pain - part of body: Knee     Time: [id number] PT Time Calculation (min) (ACUTE ONLY): 24 min  Charges:  $Gait Training: 8-22 mins $Therapeutic Activity: 8-22 mins                     Baxter Flattery, PT  Acute Rehab Dept [facility]) [phone]  [facility] Weekend Pager [facility] only)  [phone]  08/27/2022    [facility] 08/27/2022, 10:47 AM

## 2022-08-27 NOTE — Progress Notes (Signed)
Physical Therapy Treatment Patient Details Name: Robert [name] MRN: [id number] DOB: 04-May-1941 Today's Date: 08/27/2022   History of Present Illness 82 yo male presents to therapy s/p L TKA on 08/25/2022 due to failure of conservative measures. Pt PMH includes but is not limited to prostate ca s/p radiation threapy, diverticulosis, HDL, and HTN.    PT Comments    Progressing with HEP/ROM this pm, reports less pain with knee flexion/extension.  Pt repeatedly states his family is telling him "not to push it" and "not to go home too soon". Pt states he wants to go home, reports support of dtr and son-in-law at home;  pt has a ramp and plans to sleep in recliner; given this, Pt should be able to go home tomorrow with family assist from a PT standpoint.  I discussed with pt if his family will not assist and/or he feels he needs SNF placement we need to start this process immediately, I have discussed with him therapy is not a reason to stay in the hospital;    Recommendations for follow up therapy are one component of a multi-disciplinary discharge planning process, led by the attending physician.  Recommendations may be updated based on patient status, additional functional criteria and insurance authorization.  Follow Up Recommendations  Follow physician's recommendations for discharge plan and follow up therapies     Assistance Recommended at Discharge Frequent or constant Supervision/Assistance  Patient can return home with the following Assist for transportation;Help with stairs or ramp for entrance;A little help with walking and/or transfers;A little help with bathing/dressing/bathroom;Assistance with cooking/housework   Equipment Recommendations  None recommended by PT    Recommendations for Other Services       Precautions / Restrictions Precautions Precautions: Fall;Knee Restrictions Weight Bearing Restrictions: No Other Position/Activity Restrictions: LLE WBAT     Mobility   Bed Mobility   Bed Mobility: Supine to Sit     Supine to sit: Min assist     General bed mobility comments: declines OOB this pm    Transfers Overall transfer level: Needs assistance Equipment used: Rolling walker (2 wheels) Transfers: Sit to/from Stand Sit to Stand: Min assist           General transfer comment: verbal cues for hand placement and LLE position    Ambulation/Gait Ambulation/Gait assistance: Min assist, Min guard Gait Distance (Feet): 60 Feet Assistive device: Rolling walker (2 wheels) Gait Pattern/deviations: Step-to pattern, Antalgic       General Gait Details: verbal cues for sequence, use of UEs and RW position from self   Stairs             Wheelchair Mobility    Modified Rankin (Stroke Patients Only)       Balance   Sitting-balance support: Bilateral upper extremity supported, Feet supported Sitting balance-Leahy Scale: Fair     Standing balance support: Reliant on assistive device for balance, Bilateral upper extremity supported Standing balance-Leahy Scale: Poor                              Cognition Arousal/Alertness: Awake/alert Behavior During Therapy: WFL for tasks assessed/performed Overall Cognitive Status: Within Functional Limits for tasks assessed                                 General Comments: ? mild confusion. pt states "Dr Ninfa Linden gave me a whole cup of  medicine"; very unclear timeline of today's events.  reviewed with pt my role, his nurse and nurse tech for today and he verbalized understanding that  Dr Ninfa Linden did not give any meds        Exercises Total Joint Exercises Ankle Circles/Pumps: AROM, Both, 15 reps Quad Sets: AROM, Both, 10 reps Heel Slides: AAROM, Left, Other reps (comment), 10 reps Hip ABduction/ADduction: AROM, Left, 10 reps Straight Leg Raises: Left, 10 reps, AAROM Goniometric ROM: grossly 8 to 65 degrees knee flexion Other Exercises Other Exercises: 1  minute extension stretch and 20 second hold knee flexion x 3    General Comments        Pertinent Vitals/Pain Pain Assessment Pain Assessment: 0-10 Pain Score: 5  Pain Location: L knee Pain Descriptors / Indicators: Aching, Grimacing, Discomfort, Guarding Pain Intervention(s): Limited activity within patient's tolerance, Monitored during session, Premedicated before session, Repositioned    Home Living                          Prior Function            PT Goals (current goals can now be found in the care plan section) Acute Rehab PT Goals Patient Stated Goal: to get stronger and go home PT Goal Formulation: With patient/family Time For Goal Achievement: 09/08/22 Potential to Achieve Goals: Good Progress towards PT goals: Progressing toward goals    Frequency    7X/week      PT Plan Current plan remains appropriate    Co-evaluation              AM-PAC PT "6 Clicks" Mobility   Outcome Measure  Help needed turning from your back to your side while in a flat bed without using bedrails?: A Little Help needed moving from lying on your back to sitting on the side of a flat bed without using bedrails?: A Little Help needed moving to and from a bed to a chair (including a wheelchair)?: A Little Help needed standing up from a chair using your arms (e.g., wheelchair or bedside chair)?: A Little Help needed to walk in hospital room?: A Little Help needed climbing 3-5 steps with a railing? : A Lot 6 Click Score: 17    End of Session   Activity Tolerance: Patient tolerated treatment well Patient left: in bed;with call bell/phone within reach;with bed alarm set;with family/visitor present   PT Visit Diagnosis: Unsteadiness on feet (R26.81);Other abnormalities of gait and mobility (R26.89);Muscle weakness (generalized) (M62.81);Pain;Difficulty in walking, not elsewhere classified (R26.2) Pain - Right/Left: Left Pain - part of body: Knee     Time:  -      Charges:  $Therapeutic Exercise: 8-22 mins                     Baxter Flattery, PT  Acute Rehab Dept [facility]) [phone]  [facility] Weekend Pager [facility] only)  [phone]  08/27/2022    [facility] 08/27/2022, 4:42 PM

## 2022-08-27 NOTE — Discharge Summary (Signed)
Patient ID: Robert [name] MRN: [id number] DOB/AGE: August 23, 1940 82 y.o.  Admit date: 08/25/2022 Discharge date: 08/27/2022  Admission Diagnoses:  Principal Problem:   Unilateral primary osteoarthritis, left knee Active Problems:   Status post total left knee replacement   Discharge Diagnoses:  Same  Past Medical History:  Diagnosis Date   Arthritis    Diverticulosis, sigmoid 08/12/2014   Moderate, noted on colonoscopy   History of adenomatous polyp of colon    Hyperlipidemia    Hypertension    followed by pcp   (03-16-2020  per pt had stress test several years ago, told ok)   Left inguinal hernia    Prostate cancer [facility]) urologist-- dr Junious Silk   dx 2019,  Stage T1c, Gleason 3+4;  s/p  brachytherapy 03-08-2018   Wears glasses     Surgeries: Procedure(s): LEFT TOTAL KNEE ARTHROPLASTY on 08/25/2022   Consultants:   Discharged Condition: Improved  Hospital Course: QAIS WESEMANN is an 82 y.o. male who was admitted 08/25/2022 for operative treatment ofUnilateral primary osteoarthritis, left knee. Patient has severe unremitting pain that affects sleep, daily activities, and work/hobbies. After pre-op clearance the patient was taken to the operating room on 08/25/2022 and underwent  Procedure(s): LEFT TOTAL KNEE ARTHROPLASTY.    Patient was given perioperative antibiotics:  Anti-infectives (From admission, onward)    Start     Dose/Rate Route Frequency Ordered Stop   08/25/22 0615  ceFAZolin (ANCEF) IVPB 2g/100 mL premix        2 g 200 mL/hr over 30 Minutes Intravenous On call to O.R. 08/25/22 [id number] 08/25/22 [id number]        Patient was given sequential compression devices, early ambulation, and chemoprophylaxis to prevent DVT.  Patient benefited maximally from hospital stay and there were no complications.    Recent vital signs: Patient Vitals for the past 24 hrs:  BP Temp Temp src Pulse Resp SpO2  08/27/22 0529 (!) 141/77 98.5 F (36.9 C) Oral (!) 106 16 97 %  08/26/22 2144  130/80 98.5 F (36.9 C) Oral (!) 102 18 95 %  08/26/22 1321 129/79 97.8 F (36.6 C) -- (!) 101 17 98 %  08/26/22 0929 133/85 97.9 F (36.6 C) Oral 91 18 98 %     Recent laboratory studies:  Recent Labs    08/26/22 0328  WBC 14.9*  HGB 13.4  HCT 39.7  PLT 245  NA 138  K 3.7  CL 102  CO2 22  BUN 20  CREATININE 1.18  GLUCOSE 141*  CALCIUM 8.3*     Discharge Medications:   Allergies as of 08/27/2022   No Known Allergies      Medication List     STOP taking these medications    aspirin 81 MG tablet Replaced by: aspirin 81 MG chewable tablet       TAKE these medications    aspirin 81 MG chewable tablet Chew 1 tablet (81 mg total) by mouth 2 (two) times daily. Replaces: aspirin 81 MG tablet   FISH OIL PO Take 2,000 mg by mouth in the morning.   losartan-hydrochlorothiazide 100-12.5 MG tablet Commonly known as: HYZAAR Take 1 tablet by mouth daily.   methocarbamol 500 MG tablet Commonly known as: ROBAXIN Take 1 tablet (500 mg total) by mouth every 6 (six) hours as needed for muscle spasms.   multivitamin tablet Take 1 tablet by mouth in the morning.   oxyCODONE 5 MG immediate release tablet Commonly known as: Oxy IR/ROXICODONE Take 1-2 tablets (5-10 mg  total) by mouth every 4 (four) hours as needed for moderate pain (pain score 4-6).   simvastatin 40 MG tablet Commonly known as: ZOCOR Take 40 mg by mouth at bedtime.               Durable Medical Equipment  (From admission, onward)           Start     Ordered   08/25/22 1259  DME 3 n 1  Once        08/25/22 1258   08/25/22 1259  DME Walker rolling  Once       Question Answer Comment  Walker: With 5 Inch Wheels   Patient needs a walker to treat with the following condition Status post total left knee replacement      08/25/22 1258            Diagnostic Studies: DG Knee Left Port  Result Date: 08/25/2022 CLINICAL DATA:  Postop left knee arthroplasty EXAM: PORTABLE LEFT KNEE -  1-2 VIEW COMPARISON:  Left knee radiographs dated 06/14/2022 FINDINGS: Left knee arthroplasty in satisfactory position. Overlying skin stable and associated soft tissue gas. No fracture or dislocation is seen. IMPRESSION: Left knee arthroplasty, without evidence of complication. Electronically Signed   By: Julian Hy M.D.   On: 08/25/2022 11:22    Disposition: Discharge disposition: 01-Home or [place] [place] Follow up.   Specialty: [place] Why: to provide home physical therapy visits Contact information: [address] [place] [address] [phone]         Mcarthur Rossetti, MD Follow up in 2 week(s).   Specialty: Orthopedic Surgery Contact information: [address] [place] [address] [phone]                  Signed: Aundra Dubin 08/27/2022, 8:59 AM

## 2022-08-28 ENCOUNTER — Encounter (HOSPITAL_COMMUNITY): Payer: Self-pay | Admitting: Orthopaedic Surgery

## 2022-08-28 DIAGNOSIS — M1712 Unilateral primary osteoarthritis, left knee: Secondary | ICD-10-CM | POA: Diagnosis not present

## 2022-08-28 MED ORDER — SENNOSIDES-DOCUSATE SODIUM 8.6-50 MG PO TABS
1.0000 | ORAL_TABLET | Freq: Once | ORAL | Status: AC
  Administered 2022-08-28: 1 via ORAL
  Filled 2022-08-28: qty 1

## 2022-08-28 NOTE — Plan of Care (Signed)
  Problem: Education: Goal: Knowledge of the prescribed therapeutic regimen will improve Outcome: Adequate for Discharge   Problem: Activity: Goal: Ability to avoid complications of mobility impairment will improve Outcome: Adequate for Discharge Goal: Range of joint motion will improve Outcome: Adequate for Discharge   Problem: Clinical Measurements: Goal: Postoperative complications will be avoided or minimized Outcome: Adequate for Discharge   Problem: Pain Management: Goal: Pain level will decrease with appropriate interventions Outcome: Adequate for Discharge   Problem: Skin Integrity: Goal: Will show signs of wound healing Outcome: Adequate for Discharge   Problem: Health Behavior/Discharge Planning: Goal: Ability to manage health-related needs will improve Outcome: Adequate for Discharge   Problem: Clinical Measurements: Goal: Ability to maintain clinical measurements within normal limits will improve Outcome: Adequate for Discharge Goal: Will remain free from infection Outcome: Adequate for Discharge Goal: Diagnostic test results will improve Outcome: Adequate for Discharge Goal: Respiratory complications will improve Outcome: Adequate for Discharge Goal: Cardiovascular complication will be avoided Outcome: Adequate for Discharge   Problem: Activity: Goal: Risk for activity intolerance will decrease Outcome: Adequate for Discharge   Problem: Coping: Goal: Level of anxiety will decrease Outcome: Adequate for Discharge   Problem: Elimination: Goal: Will not experience complications related to bowel motility Outcome: Adequate for Discharge Goal: Will not experience complications related to urinary retention Outcome: Adequate for Discharge   Problem: Pain Managment: Goal: General experience of comfort will improve Outcome: Adequate for Discharge   Problem: Safety: Goal: Ability to remain free from injury will improve Outcome: Adequate for Discharge    Problem: Skin Integrity: Goal: Risk for impaired skin integrity will decrease Outcome: Adequate for Discharge

## 2022-08-28 NOTE — Progress Notes (Signed)
Physical Therapy Treatment Patient Details Name: Robert [name] MRN: [id number] DOB: [birth year] Today's Date: 08/28/2022   History of Present Illness 82 yo male presents to therapy s/p L TKA on 08/25/2022 due to failure of conservative measures. Pt PMH includes but is not limited to prostate ca s/p radiation threapy, diverticulosis, HDL, and HTN.    PT Comments    Pt meeting PT goals; reports N/V last night, has not eaten today, reports negative flatus/BM, abdomen is firm, asked pt to order food and notified RN, may want to check for active bowel sounds prior to d/c.  OK to d/c with family assist from PT standpoint.    Recommendations for follow up therapy are one component of a multi-disciplinary discharge planning process, led by the attending physician.  Recommendations may be updated based on patient status, additional functional criteria and insurance authorization.  Follow Up Recommendations  Follow physician's recommendations for discharge plan and follow up therapies     Assistance Recommended at Discharge Frequent or constant Supervision/Assistance  Patient can return home with the following Assist for transportation;Help with stairs or ramp for entrance;A little help with walking and/or transfers;A little help with bathing/dressing/bathroom;Assistance with cooking/housework   Equipment Recommendations  None recommended by PT    Recommendations for Other Services       Precautions / Restrictions Precautions Precautions: Fall;Knee Required Braces or Orthoses: Knee Immobilizer - Left Knee Immobilizer - Left: Discontinue once straight leg raise with < 10 degree lag Restrictions Weight Bearing Restrictions: No Other Position/Activity Restrictions: LLE WBAT     Mobility  Bed Mobility Overal bed mobility: Needs Assistance Bed Mobility: Supine to Sit     Supine to sit: Min guard     General bed mobility comments: min/guard for safety, incr time and effort     Transfers Overall transfer level: Needs assistance Equipment used: Rolling walker (2 wheels) Transfers: Sit to/from Stand Sit to Stand: Min guard           General transfer comment: verbal cues for hand placement and LLE position    Ambulation/Gait Ambulation/Gait assistance: Min guard, Supervision Gait Distance (Feet): 120 Feet Assistive device: Rolling walker (2 wheels) Gait Pattern/deviations: Step-to pattern       General Gait Details: verbal cues for sequence, use of UEs and RW position from self, steady today. no LOB   Marine scientist Rankin (Stroke Patients Only)       Balance Overall balance assessment: Needs assistance Sitting-balance support: Single extremity supported, Feet supported Sitting balance-Leahy Scale: Good     Standing balance support: Reliant on assistive device for balance, Bilateral upper extremity supported Standing balance-Leahy Scale: Poor                              Cognition Arousal/Alertness: Awake/alert Behavior During Therapy: WFL for tasks assessed/performed Overall Cognitive Status: Within Functional Limits for tasks assessed                                          Exercises Total Joint Exercises Ankle Circles/Pumps: AROM, Both, 15 reps Quad Sets: AROM, Both, 10 reps Heel Slides: AAROM, Left, Other reps (comment), 10 reps Hip ABduction/ADduction: AROM, Left, 10 reps Straight Leg Raises: Left, AAROM, 20 reps  General Comments        Pertinent Vitals/Pain Pain Assessment Pain Assessment: 0-10 Pain Score: 5  Pain Location: L knee Pain Descriptors / Indicators: Aching, Grimacing, Discomfort, Guarding Pain Intervention(s): Limited activity within patient's tolerance, Monitored during session, Repositioned, Ice applied    Home Living                          Prior Function            PT Goals (current goals can now be found  in the care plan section) Acute Rehab PT Goals Patient Stated Goal: to get stronger and go home PT Goal Formulation: With patient/family Time For Goal Achievement: 09/08/22 Potential to Achieve Goals: Good    Frequency    7X/week      PT Plan Current plan remains appropriate    Co-evaluation              AM-PAC PT "6 Clicks" Mobility   Outcome Measure  Help needed turning from your back to your side while in a flat bed without using bedrails?: A Little Help needed moving from lying on your back to sitting on the side of a flat bed without using bedrails?: A Little Help needed moving to and from a bed to a chair (including a wheelchair)?: A Little Help needed standing up from a chair using your arms (e.g., wheelchair or bedside chair)?: A Little Help needed to walk in hospital room?: A Little Help needed climbing 3-5 steps with a railing? : A Lot 6 Click Score: 17    End of Session Equipment Utilized During Treatment: Gait belt Activity Tolerance: Patient tolerated treatment well Patient left: in bed;with call bell/phone within reach;with bed alarm set;with family/visitor present   PT Visit Diagnosis: Unsteadiness on feet (R26.81);Other abnormalities of gait and mobility (R26.89);Muscle weakness (generalized) (M62.81);Pain;Difficulty in walking, not elsewhere classified (R26.2) Pain - Right/Left: Left Pain - part of body: Knee     Time: [id number] PT Time Calculation (min) (ACUTE ONLY): 28 min  Charges:  $Gait Training: 8-22 mins $Therapeutic Exercise: 8-22 mins                     Baxter Flattery, PT  Acute Rehab Dept [facility]) [phone]  [facility] Weekend Pager [facility] only)  [phone]  08/28/2022    [facility] 08/28/2022, 10:21 AM

## 2022-08-28 NOTE — Progress Notes (Signed)
Patient ID: Robert [name], male   DOB: 1940-07-13, 82 y.o.   MRN: [id number] The patient is awake and alert this morning.  His vital signs are stable.  We will see if we can get him discharged to home sometime today.  His left operative knee is stable.

## 2022-08-29 DIAGNOSIS — Z9181 History of falling: Secondary | ICD-10-CM | POA: Diagnosis not present

## 2022-08-29 DIAGNOSIS — I10 Essential (primary) hypertension: Secondary | ICD-10-CM | POA: Diagnosis not present

## 2022-08-29 DIAGNOSIS — M1990 Unspecified osteoarthritis, unspecified site: Secondary | ICD-10-CM | POA: Diagnosis not present

## 2022-08-29 DIAGNOSIS — E785 Hyperlipidemia, unspecified: Secondary | ICD-10-CM | POA: Diagnosis not present

## 2022-08-29 DIAGNOSIS — K5730 Diverticulosis of large intestine without perforation or abscess without bleeding: Secondary | ICD-10-CM | POA: Diagnosis not present

## 2022-08-29 DIAGNOSIS — Z7982 Long term (current) use of aspirin: Secondary | ICD-10-CM | POA: Diagnosis not present

## 2022-08-29 DIAGNOSIS — Z96652 Presence of left artificial knee joint: Secondary | ICD-10-CM | POA: Diagnosis not present

## 2022-08-29 DIAGNOSIS — Z8546 Personal history of malignant neoplasm of prostate: Secondary | ICD-10-CM | POA: Diagnosis not present

## 2022-08-29 DIAGNOSIS — Z471 Aftercare following joint replacement surgery: Secondary | ICD-10-CM | POA: Diagnosis not present

## 2022-08-30 ENCOUNTER — Telehealth: Payer: Self-pay | Admitting: *Deleted

## 2022-08-30 NOTE — Telephone Encounter (Signed)
Ortho bundle D/C call completed. 

## 2022-08-31 DIAGNOSIS — Z8546 Personal history of malignant neoplasm of prostate: Secondary | ICD-10-CM | POA: Diagnosis not present

## 2022-08-31 DIAGNOSIS — Z7982 Long term (current) use of aspirin: Secondary | ICD-10-CM | POA: Diagnosis not present

## 2022-08-31 DIAGNOSIS — E785 Hyperlipidemia, unspecified: Secondary | ICD-10-CM | POA: Diagnosis not present

## 2022-08-31 DIAGNOSIS — Z9181 History of falling: Secondary | ICD-10-CM | POA: Diagnosis not present

## 2022-08-31 DIAGNOSIS — Z471 Aftercare following joint replacement surgery: Secondary | ICD-10-CM | POA: Diagnosis not present

## 2022-08-31 DIAGNOSIS — K5730 Diverticulosis of large intestine without perforation or abscess without bleeding: Secondary | ICD-10-CM | POA: Diagnosis not present

## 2022-08-31 DIAGNOSIS — M1990 Unspecified osteoarthritis, unspecified site: Secondary | ICD-10-CM | POA: Diagnosis not present

## 2022-08-31 DIAGNOSIS — I10 Essential (primary) hypertension: Secondary | ICD-10-CM | POA: Diagnosis not present

## 2022-08-31 DIAGNOSIS — Z96652 Presence of left artificial knee joint: Secondary | ICD-10-CM | POA: Diagnosis not present

## 2022-09-02 DIAGNOSIS — Z471 Aftercare following joint replacement surgery: Secondary | ICD-10-CM | POA: Diagnosis not present

## 2022-09-02 DIAGNOSIS — K5730 Diverticulosis of large intestine without perforation or abscess without bleeding: Secondary | ICD-10-CM | POA: Diagnosis not present

## 2022-09-02 DIAGNOSIS — Z96652 Presence of left artificial knee joint: Secondary | ICD-10-CM | POA: Diagnosis not present

## 2022-09-02 DIAGNOSIS — Z8546 Personal history of malignant neoplasm of prostate: Secondary | ICD-10-CM | POA: Diagnosis not present

## 2022-09-02 DIAGNOSIS — Z9181 History of falling: Secondary | ICD-10-CM | POA: Diagnosis not present

## 2022-09-02 DIAGNOSIS — M1990 Unspecified osteoarthritis, unspecified site: Secondary | ICD-10-CM | POA: Diagnosis not present

## 2022-09-02 DIAGNOSIS — Z7982 Long term (current) use of aspirin: Secondary | ICD-10-CM | POA: Diagnosis not present

## 2022-09-02 DIAGNOSIS — I10 Essential (primary) hypertension: Secondary | ICD-10-CM | POA: Diagnosis not present

## 2022-09-02 DIAGNOSIS — E785 Hyperlipidemia, unspecified: Secondary | ICD-10-CM | POA: Diagnosis not present

## 2022-09-04 DIAGNOSIS — K5730 Diverticulosis of large intestine without perforation or abscess without bleeding: Secondary | ICD-10-CM | POA: Diagnosis not present

## 2022-09-04 DIAGNOSIS — Z7982 Long term (current) use of aspirin: Secondary | ICD-10-CM | POA: Diagnosis not present

## 2022-09-04 DIAGNOSIS — Z8546 Personal history of malignant neoplasm of prostate: Secondary | ICD-10-CM | POA: Diagnosis not present

## 2022-09-04 DIAGNOSIS — Z96652 Presence of left artificial knee joint: Secondary | ICD-10-CM | POA: Diagnosis not present

## 2022-09-04 DIAGNOSIS — I10 Essential (primary) hypertension: Secondary | ICD-10-CM | POA: Diagnosis not present

## 2022-09-04 DIAGNOSIS — M1990 Unspecified osteoarthritis, unspecified site: Secondary | ICD-10-CM | POA: Diagnosis not present

## 2022-09-04 DIAGNOSIS — Z471 Aftercare following joint replacement surgery: Secondary | ICD-10-CM | POA: Diagnosis not present

## 2022-09-04 DIAGNOSIS — Z9181 History of falling: Secondary | ICD-10-CM | POA: Diagnosis not present

## 2022-09-04 DIAGNOSIS — E785 Hyperlipidemia, unspecified: Secondary | ICD-10-CM | POA: Diagnosis not present

## 2022-09-06 ENCOUNTER — Telehealth: Payer: Self-pay | Admitting: *Deleted

## 2022-09-06 ENCOUNTER — Encounter: Payer: Self-pay | Admitting: Orthopaedic Surgery

## 2022-09-06 ENCOUNTER — Ambulatory Visit (INDEPENDENT_AMBULATORY_CARE_PROVIDER_SITE_OTHER): Payer: Medicare PPO | Admitting: Orthopaedic Surgery

## 2022-09-06 DIAGNOSIS — Z96652 Presence of left artificial knee joint: Secondary | ICD-10-CM

## 2022-09-06 NOTE — Progress Notes (Signed)
HPI: [name] returns today status post left total knee arthroplasty 08/25/2022.  He is ambulating with a walker.  He is taking oxycodone.  He is unable to take Robaxin felt this was causing him some constipation.  He is taking 81 mg aspirin twice daily.  He ranks his knee pain to be moderate.  He feels that his range of motion and strength are improving with physical therapy.  Denies any fevers or chills.  Review of systems see HPI otherwise negative  Physical exam: General: Well-developed well-nourished male no acute distress. Left knee full extension flexion to 90 degrees.  Surgical incisions healing well no signs of dehiscence or infection.  Calf supple nontender dorsiflexion plantarflexion left ankle intact.  Staples well-approximated the incision.  Staples were harvested today.  Steri-Strips applied.   Impression: Status post left total knee arthroplasty 08/25/2022  Plan: He will continue therapy transitioning to outpatient therapy.  Scar tissue mobilization encouraged.  Questions were encouraged and answered patient and his daughter is present today.  He is able to get the incision wet in shower but no submerging of the incision.  He will follow-up with [place] in 1 month sooner if there is any questions or concerns.  Continue compression stockings during the day for swelling.  He is on 81 mg aspirin prior to surgery and he will continue this

## 2022-09-06 NOTE — Telephone Encounter (Signed)
OPPT called back and are going to reach out to patient to schedule for therapy next week.

## 2022-09-06 NOTE — Telephone Encounter (Signed)
Ortho bundle call completed. 

## 2022-09-07 DIAGNOSIS — E785 Hyperlipidemia, unspecified: Secondary | ICD-10-CM | POA: Diagnosis not present

## 2022-09-07 DIAGNOSIS — I10 Essential (primary) hypertension: Secondary | ICD-10-CM | POA: Diagnosis not present

## 2022-09-07 DIAGNOSIS — Z471 Aftercare following joint replacement surgery: Secondary | ICD-10-CM | POA: Diagnosis not present

## 2022-09-07 DIAGNOSIS — Z8546 Personal history of malignant neoplasm of prostate: Secondary | ICD-10-CM | POA: Diagnosis not present

## 2022-09-07 DIAGNOSIS — K5730 Diverticulosis of large intestine without perforation or abscess without bleeding: Secondary | ICD-10-CM | POA: Diagnosis not present

## 2022-09-07 DIAGNOSIS — Z9181 History of falling: Secondary | ICD-10-CM | POA: Diagnosis not present

## 2022-09-07 DIAGNOSIS — Z96652 Presence of left artificial knee joint: Secondary | ICD-10-CM | POA: Diagnosis not present

## 2022-09-07 DIAGNOSIS — Z7982 Long term (current) use of aspirin: Secondary | ICD-10-CM | POA: Diagnosis not present

## 2022-09-07 DIAGNOSIS — M1990 Unspecified osteoarthritis, unspecified site: Secondary | ICD-10-CM | POA: Diagnosis not present

## 2022-09-08 DIAGNOSIS — M1990 Unspecified osteoarthritis, unspecified site: Secondary | ICD-10-CM | POA: Diagnosis not present

## 2022-09-08 DIAGNOSIS — K5730 Diverticulosis of large intestine without perforation or abscess without bleeding: Secondary | ICD-10-CM | POA: Diagnosis not present

## 2022-09-08 DIAGNOSIS — Z96652 Presence of left artificial knee joint: Secondary | ICD-10-CM | POA: Diagnosis not present

## 2022-09-08 DIAGNOSIS — Z9181 History of falling: Secondary | ICD-10-CM | POA: Diagnosis not present

## 2022-09-08 DIAGNOSIS — I10 Essential (primary) hypertension: Secondary | ICD-10-CM | POA: Diagnosis not present

## 2022-09-08 DIAGNOSIS — E785 Hyperlipidemia, unspecified: Secondary | ICD-10-CM | POA: Diagnosis not present

## 2022-09-08 DIAGNOSIS — Z8546 Personal history of malignant neoplasm of prostate: Secondary | ICD-10-CM | POA: Diagnosis not present

## 2022-09-08 DIAGNOSIS — Z7982 Long term (current) use of aspirin: Secondary | ICD-10-CM | POA: Diagnosis not present

## 2022-09-08 DIAGNOSIS — Z471 Aftercare following joint replacement surgery: Secondary | ICD-10-CM | POA: Diagnosis not present

## 2022-09-11 ENCOUNTER — Ambulatory Visit: Payer: Medicare PPO | Attending: Orthopaedic Surgery | Admitting: Physical Therapy

## 2022-09-11 DIAGNOSIS — M25562 Pain in left knee: Secondary | ICD-10-CM | POA: Diagnosis not present

## 2022-09-11 DIAGNOSIS — M1712 Unilateral primary osteoarthritis, left knee: Secondary | ICD-10-CM | POA: Insufficient documentation

## 2022-09-11 NOTE — Therapy (Signed)
OUTPATIENT PHYSICAL THERAPY LOWER EXTREMITY EVALUATION   Patient Name: Robert [name] MRN: [id number] DOB:02-26-1941, 82 y.o., male Today's Date: 09/11/2022  END OF SESSION:  PT End of Session - 09/11/22 1007     Visit Number 1    Number of Visits 17    Date for PT Re-Evaluation 11/24/22    Authorization - Visit Number 1    Authorization - Number of Visits 10    Progress Note Due on Visit 10    PT Start Time 1000    PT Stop Time 1045    PT Time Calculation (min) 45 min    Equipment Utilized During Treatment Gait belt    Activity Tolerance Patient tolerated treatment well    Behavior During Therapy WFL for tasks assessed/performed             Past Medical History:  Diagnosis Date   Arthritis    Diverticulosis, sigmoid 08/12/2014   Moderate, noted on colonoscopy   History of adenomatous polyp of colon    Hyperlipidemia    Hypertension    followed by pcp   (03-16-2020  per pt had stress test several years ago, told ok)   Left inguinal hernia    Prostate cancer [facility]) urologist-- dr Junious Silk   dx 2019,  Stage T1c, Gleason 3+4;  s/p  brachytherapy 03-08-2018   Wears glasses    Past Surgical History:  Procedure Laterality Date   COLONOSCOPY W/ POLYPECTOMY  last one 12-18-2019  dr Henrene Pastor   CYSTOSCOPY N/A 03/08/2018   Procedure: CYSTOSCOPY;  Surgeon: Festus Aloe, MD;  Location: [facility];  Service: Urology;  Laterality: N/A;  no seeds in bladder per dr eskridge   INGUINAL HERNIA REPAIR Left 03/19/2020   Procedure: LEFT INGUINAL HERNIA REPAIR;  Surgeon: Kieth Brightly, Arta Bruce, MD;  Location: [facility];  Service: General;  Laterality: Left;   INSERTION OF MESH Left 03/19/2020   Procedure: INSERTION OF MESH;  Surgeon: Kinsinger, Arta Bruce, MD;  Location: [facility];  Service: General;  Laterality: Left;   PROSTATE BIOPSY  10/16/2017   RADIOACTIVE SEED IMPLANT N/A 03/08/2018   Procedure: RADIOACTIVE SEED IMPLANT/BRACHYTHERAPY  IMPLANT;  Surgeon: Festus Aloe, MD;  Location: [place];  Service: Urology;  Laterality: N/A;  86 seeds   SPACE OAR INSTILLATION N/A 03/08/2018   Procedure: SPACE OAR INSTILLATION;  Surgeon: Festus Aloe, MD;  Location: [facility];  Service: Urology;  Laterality: N/A;   TONSILLECTOMY  child   TOTAL KNEE ARTHROPLASTY Left 08/25/2022   Procedure: LEFT TOTAL KNEE ARTHROPLASTY;  Surgeon: Mcarthur Rossetti, MD;  Location: [facility];  Service: Orthopedics;  Laterality: Left;   Patient Active Problem List   Diagnosis Date Noted   Status post total left knee replacement 08/25/2022   Unilateral primary osteoarthritis, left knee 06/14/2022   Malignant neoplasm of prostate ([place]) 12/10/2017    PCP: Philip Aspen MD  REFERRING PROVIDER: Philis Fendt MD  REFERRING DIAG: L TKA  THERAPY DIAG:  Acute pain of left knee  Rationale for Evaluation and Treatment: Rehabilitation  ONSET DATE: 08/25/22  SUBJECTIVE:   SUBJECTIVE STATEMENT: L TKA 08/25/22  PERTINENT HISTORY: Pt is an 82 year old male presenting s/p L TKA 08/25/22. PLOF ambulation without AD, complete ind with all ADLs. Currently ambulating with RW in the home and community. Lives at home with wife, with family in the area. His daughter is staying with him to make meals, clean, drive, etc. Reports he is dressing himself  though he does need assistance from daughter to don/doff ted hose. Home with ramped entry, single level, walk in shower with seat and grab bars, and standard height toilet seat. Current pain level 5/10 best: /10 worst 8/10. Pt has had HHPT since surgery, and is doing mini squats, and walking down the hall at home. Is taking oxycodone as needed for pain. No falls in past 6 months. Pt denies N/V, B&B changes, unexplained weight fluctuation, saddle paresthesia, fever, night sweats, or unrelenting night pain at this time.   PAIN:  Are you having pain? Yes: NPRS scale: 5/10 Pain location: L  knee Pain description: aching Aggravating factors: standing, walking Relieving factors: rest  PRECAUTIONS: None  WEIGHT BEARING RESTRICTIONS: No  FALLS:  Has patient fallen in last 6 months? No  LIVING ENVIRONMENT: Lives with: lives with their family Lives in: House/apartment Stairs: No Has following equipment at home: Single point cane, Environmental consultant - 2 wheeled, Electronics engineer, Grab bars, and Ramped entry  OCCUPATION: Retired  PLOF: Independent with basic ADLs  PATIENT GOALS: Be able to walk without RW  NEXT MD VISIT: 09/27/22  OBJECTIVE:   DIAGNOSTIC FINDINGS:   PATIENT SURVEYS:  FOTO 36 goal 27  COGNITION: Overall cognitive status: Within functional limits for tasks assessed     SENSATION: WFL  EDEMA:  Circumferential: L mid patella 46cm; R mid patell 41cm  MUSCLE LENGTH: Hamstrings: shortened bilat Thomas test: shortened RLE; unable to test on L  POSTURE: rounded shoulders, forward head, flexed trunk , and weight shift right  PALPATION: Swelling inhibiting patellar movement; ttp diffuse of L knee  LOWER EXTREMITY ROM:  Active ROM Right eval Left eval  Hip flexion 110/110 90/110  Hip extension ~5/10 bilat  Hip abduction    Hip adduction    Hip internal rotation 50% limited bilat  Hip external rotation    Knee flexion 128 75/78  Knee extension 0 25/22  Ankle dorsiflexion 15 14  Ankle plantarflexion    Ankle inversion    Ankle eversion     (Blank rows = not tested)  LOWER EXTREMITY MMT:  MMT Right eval Left eval  Hip flexion 4+ 2+  Hip extension 3- 2+  Hip abduction 4- 3+  Hip adduction    Hip internal rotation 5 4+  Hip external rotation 4+ 4  Knee flexion 4+ 3+  Knee extension 4+ 3  Ankle dorsiflexion 4+ 4+  Ankle plantarflexion    Ankle inversion    Ankle eversion     (Blank rows = not tested)   FUNCTIONAL TESTS:  5 times sit to stand: 40sec with UE use for push off 10 meter walk test: self selected: 0.30ms fastest: 0.410m SLS  unable bilat  GAIT: Distance walked: 3M Assistive device utilized: WaEnvironmental consultant 2 wheeled Level of assistance: Modified independence Comments: decreased L stance time; decreased RLE step length, heavy reliance on RW with RLE swing    TODAY'S TREATMENT:  DATE: 09/11/22 PT reviewed the following HEP with patient with patient able to demonstrate a set of the following with min cuing for correction needed. PT educated patient on parameters of therex (how/when to inc/decrease intensity, frequency, rep/set range, stretch hold time, and purpose of therex) with verbalized understanding.  Access Code: [email or web address]  - Seated Heel Slide  - 3-5 x daily - 7 x weekly - 12-20 reps - 2sec hold - Supine Knee Extension Mobilization with Weight  - 3-5 x daily - 7 x weekly - 1-16mn hold - Supine Quad Set  - 3-5 x daily - 7 x weekly - 10 reps - 5sec hold - Mini Squat with Counter Support  - 1 x daily - 7 x weekly - 3 sets - 6-10 reps    PATIENT EDUCATION:  Education details: Patient was educated on diagnosis, anatomy and pathology involved, prognosis, role of PT, and was given an HEP, demonstrating exercise with proper form following verbal and tactile cues, and was given a paper hand out to continue exercise at home. Pt was educated on and agreed to plan of care.  Person educated: Patient Education method: Explanation, Demonstration, and Verbal cues Education comprehension: verbalized understanding, returned demonstration, and verbal cues required  HOME EXERCISE PROGRAM: [email or web address]  ASSESSMENT:  CLINICAL IMPRESSION: Patient is a 82y.o. male who was seen today for physical therapy evaluation and treatment for L TKA 08/25/22. Impairments in edema, knee ROM, hip and leg strength, abnormal gait, abnormal posture, and pain. Activity limitations in basic transfers, donning/doffing shoes socks,  bathing, cooking, cleaning, squatting, community ambulation, lifting; inhibiting full participation in ADLs. Would benefit from skilled PT to address above deficits and promote optimal return to PLOF.    OBJECTIVE IMPAIRMENTS: carrying, lifting, bending, standing, squatting, stairs, transfers, bed mobility, bathing, dressing, hygiene/grooming, and caring for others.   ACTIVITY LIMITATIONS: carrying, lifting, bending, sitting, standing, squatting, sleeping, transfers, bed mobility, bathing, toileting, and locomotion level  PARTICIPATION LIMITATIONS: meal prep, cleaning, laundry, shopping, community activity, and yard work  PERSONAL FACTORS: Age, Fitness, Past/current experiences, Time since onset of injury/illness/exacerbation, and 3+ comorbidities: HLD, HTN, athritis, prostate cancer  are also affecting patient's functional outcome.   REHAB POTENTIAL: Good  CLINICAL DECISION MAKING: Evolving/moderate complexity  EVALUATION COMPLEXITY: Moderate   GOALS: Goals reviewed with patient? Yes  SHORT TERM GOALS: Target date: 10/08/22 Pt will be independent with HEP in order to improve strength and balance in order to decrease fall risk and improve function at home and work.  Baseline: HEP given Goal status: INITIAL   LONG TERM GOALS: Target date: 11/17/22  Pt will demonstrate L knee mobility of 0-120d in order to demonstrate mobility needed for normalized gait Baseline: 22-75 Goal status: INITIAL  2.  Patient will increase FOTO score to 61 to demonstrate predicted increase in functional mobility to complete ADLs  Baseline: 36 Goal status: INITIAL  3.  Pt will demonstrate 5TSTS by at least 13 seconds in order to demonstrate age matched LE strength needed for ind transfers  Baseline: 40sec Goal status: INITIAL  4.  Pt will increase 10MWT to at least 1.058m in order to demonstrate community ambulation speed with decreased fall risk   Baseline: self selected: 0.3138mfastest:  0.48m54moal status: INITIAL     PLAN:  PT FREQUENCY: 1-2x/week  PT DURATION: 8 weeks  PLANNED INTERVENTIONS: Therapeutic exercises, Therapeutic activity, Neuromuscular re-education, Balance training, Gait training, Patient/Family education, Self Care, Joint mobilization, Joint manipulation, Stair training, Dry Needling, and Electrical stimulation  PLAN  FOR NEXT SESSION:   Durwin Reges DPT  Durwin Reges, PT 09/11/2022, 3:18 PM

## 2022-09-15 ENCOUNTER — Ambulatory Visit: Payer: Medicare PPO | Admitting: Physical Therapy

## 2022-09-15 DIAGNOSIS — M25562 Pain in left knee: Secondary | ICD-10-CM

## 2022-09-15 DIAGNOSIS — M1712 Unilateral primary osteoarthritis, left knee: Secondary | ICD-10-CM | POA: Diagnosis not present

## 2022-09-15 NOTE — Therapy (Signed)
OUTPATIENT PHYSICAL THERAPY LOWER EXTREMITY EVALUATION   Patient Name: Robert [name] MRN: [id number] DOB:August 01, 1940, 82 y.o., male Today's Date: 09/15/2022  END OF SESSION:  PT End of Session - 09/15/22 0831     Visit Number 2    Number of Visits 17    Date for PT Re-Evaluation 11/24/22    Authorization - Visit Number 2    Authorization - Number of Visits 10    Progress Note Due on Visit 10    PT Start Time 0830    PT Stop Time 0910    PT Time Calculation (min) 40 min    Equipment Utilized During Treatment Gait belt    Activity Tolerance Patient tolerated treatment well    Behavior During Therapy WFL for tasks assessed/performed              Past Medical History:  Diagnosis Date   Arthritis    Diverticulosis, sigmoid 08/12/2014   Moderate, noted on colonoscopy   History of adenomatous polyp of colon    Hyperlipidemia    Hypertension    followed by pcp   (03-16-2020  per pt had stress test several years ago, told ok)   Left inguinal hernia    Prostate cancer [facility]) urologist-- dr Junious Silk   dx 2019,  Stage T1c, Gleason 3+4;  s/p  brachytherapy 03-08-2018   Wears glasses    Past Surgical History:  Procedure Laterality Date   COLONOSCOPY W/ POLYPECTOMY  last one 12-18-2019  dr Henrene Pastor   CYSTOSCOPY N/A 03/08/2018   Procedure: CYSTOSCOPY;  Surgeon: Festus Aloe, MD;  Location: [facility];  Service: Urology;  Laterality: N/A;  no seeds in bladder per dr eskridge   INGUINAL HERNIA REPAIR Left 03/19/2020   Procedure: LEFT INGUINAL HERNIA REPAIR;  Surgeon: Kieth Brightly, Arta Bruce, MD;  Location: [facility];  Service: General;  Laterality: Left;   INSERTION OF MESH Left 03/19/2020   Procedure: INSERTION OF MESH;  Surgeon: Kinsinger, Arta Bruce, MD;  Location[facility];  Service: General;  Laterality: Left;   PROSTATE BIOPSY  10/16/2017   RADIOACTIVE SEED IMPLANT N/A 03/08/2018   Procedure: RADIOACTIVE SEED IMPLANT/BRACHYTHERAPY  IMPLANT;  Surgeon: Festus Aloe, MD;  Location: [place];  Service: Urology;  Laterality: N/A;  86 seeds   SPACE OAR INSTILLATION N/A 03/08/2018   Procedure: SPACE OAR INSTILLATION;  Surgeon: Festus Aloe, MD;  Location: [facility];  Service: Urology;  Laterality: N/A;   TONSILLECTOMY  child   TOTAL KNEE ARTHROPLASTY Left 08/25/2022   Procedure: LEFT TOTAL KNEE ARTHROPLASTY;  Surgeon: Mcarthur Rossetti, MD;  Location: [facility];  Service: Orthopedics;  Laterality: Left;   Patient Active Problem List   Diagnosis Date Noted   Status post total left knee replacement 08/25/2022   Unilateral primary osteoarthritis, left knee 06/14/2022   Malignant neoplasm of prostate ([place]) 12/10/2017    PCP: Philip Aspen MD  REFERRING PROVIDER: Philis Fendt MD  REFERRING DIAG: L TKA  THERAPY DIAG:  Acute pain of left knee  Rationale for Evaluation and Treatment: Rehabilitation  ONSET DATE: 08/25/22  SUBJECTIVE:   SUBJECTIVE STATEMENT: Pt arrives reporting he is seeing some improvement. Is having 5/10 pain this am.   PERTINENT HISTORY: Pt is an 82 year old male presenting s/p L TKA 08/25/22. PLOF ambulation without AD, complete ind with all ADLs. Currently ambulating with RW in the home and community. Lives at home with wife, with family in the area. His daughter is staying  with him to make meals, clean, drive, etc. Reports he is dressing himself though he does need assistance from daughter to don/doff ted hose. Home with ramped entry, single level, walk in shower with seat and grab bars, and standard height toilet seat. Current pain level 5/10 best: /10 worst 8/10. Pt has had HHPT since surgery, and is doing mini squats, and walking down the hall at home. Is taking oxycodone as needed for pain. No falls in past 6 months. Pt denies N/V, B&B changes, unexplained weight fluctuation, saddle paresthesia, fever, night sweats, or unrelenting night pain at this time.   PAIN:   Are you having pain? Yes: NPRS scale: 5/10 Pain location: L knee Pain description: aching Aggravating factors: standing, walking Relieving factors: rest  PRECAUTIONS: None  WEIGHT BEARING RESTRICTIONS: No  FALLS:  Has patient fallen in last 6 months? No  LIVING ENVIRONMENT: Lives with: lives with their family Lives in: House/apartment Stairs: No Has following equipment at home: Single point cane, Environmental consultant - 2 wheeled, Electronics engineer, Grab bars, and Ramped entry  OCCUPATION: Retired  PLOF: Independent with basic ADLs  PATIENT GOALS: Be able to walk without RW  NEXT MD VISIT: 09/27/22  OBJECTIVE:   DIAGNOSTIC FINDINGS:   PATIENT SURVEYS:  FOTO 36 goal 42  COGNITION: Overall cognitive status: Within functional limits for tasks assessed     SENSATION: WFL  EDEMA:  Circumferential: L mid patella 46cm; R mid patell 41cm  MUSCLE LENGTH: Hamstrings: shortened bilat Thomas test: shortened RLE; unable to test on L  POSTURE: rounded shoulders, forward head, flexed trunk , and weight shift right  PALPATION: Swelling inhibiting patellar movement; ttp diffuse of L knee  LOWER EXTREMITY ROM:  Active ROM Right eval Left eval  Hip flexion 110/110 90/110  Hip extension ~5/10 bilat  Hip abduction    Hip adduction    Hip internal rotation 50% limited bilat  Hip external rotation    Knee flexion 128 75/78  Knee extension 0 25/22  Ankle dorsiflexion 15 14  Ankle plantarflexion    Ankle inversion    Ankle eversion     (Blank rows = not tested)  LOWER EXTREMITY MMT:  MMT Right eval Left eval  Hip flexion 4+ 2+  Hip extension 3- 2+  Hip abduction 4- 3+  Hip adduction    Hip internal rotation 5 4+  Hip external rotation 4+ 4  Knee flexion 4+ 3+  Knee extension 4+ 3  Ankle dorsiflexion 4+ 4+  Ankle plantarflexion    Ankle inversion    Ankle eversion     (Blank rows = not tested)   FUNCTIONAL TESTS:  5 times sit to stand: 40sec with UE use for push off 10  meter walk test: self selected: 0.76ms fastest: 0.477m SLS unable bilat  GAIT: Distance walked: 46M Assistive device utilized: WaEnvironmental consultant 2 wheeled Level of assistance: Modified independence Comments: decreased L stance time; decreased RLE step length, heavy reliance on RW with RLE swing    TODAY'S TREATMENT:  DATE: 09/11/22 recumbant bike AAROM knee flex/ext 526mns Seated heel slides x12 with 3-5sec Heel prop stretch 30sec (to tolerance) with mobilization x30sec; active rest with 152m patellar mobilization all directions between sets Quad set x12 3sec hold supine LAQ in available range 2x 12 Mini squat 2x 10 with bilat UE support with max cuing initially for proper technique Amb [id number] SPC CGA for safety with good safety, decent carry over of gait notes  PATIENT EDUCATION:  Education details: Patient was educated on diagnosis, anatomy and pathology involved, prognosis, role of PT, and was given an HEP, demonstrating exercise with proper form following verbal and tactile cues, and was given a paper hand out to continue exercise at home. Pt was educated on and agreed to plan of care.  Person educated: Patient Education method: Explanation, Demonstration, and Verbal cues Education comprehension: verbalized understanding, returned demonstration, and verbal cues required  HOME EXERCISE PROGRAM: [id number]  ASSESSMENT:  CLINICAL IMPRESSION: PT initiated therex progression for increased knee mobility and LLE strength with success. Pt with increased pain with most therex, subsides with rest. PT reviewed HEP and encouraged compliance with understanding. Pt able to comply with all cuing for proper technique of therex. Would benefit from skilled PT to address above deficits and promote optimal return to PLOF.    OBJECTIVE IMPAIRMENTS: carrying, lifting, bending,  standing, squatting, stairs, transfers, bed mobility, bathing, dressing, hygiene/grooming, and caring for others.   ACTIVITY LIMITATIONS: carrying, lifting, bending, sitting, standing, squatting, sleeping, transfers, bed mobility, bathing, toileting, and locomotion level  PARTICIPATION LIMITATIONS: meal prep, cleaning, laundry, shopping, community activity, and yard work  PERSONAL FACTORS: Age, Fitness, Past/current experiences, Time since onset of injury/illness/exacerbation, and 3+ comorbidities: HLD, HTN, athritis, prostate cancer  are also affecting patient's functional outcome.   REHAB POTENTIAL: Good  CLINICAL DECISION MAKING: Evolving/moderate complexity  EVALUATION COMPLEXITY: Moderate   GOALS: Goals reviewed with patient? Yes  SHORT TERM GOALS: Target date: 10/08/22 Pt will be independent with HEP in order to improve strength and balance in order to decrease fall risk and improve function at home and work.  Baseline: HEP given Goal status: INITIAL   LONG TERM GOALS: Target date: 11/17/22  Pt will demonstrate L knee mobility of 0-120d in order to demonstrate mobility needed for normalized gait Baseline: 22-75 Goal status: INITIAL  2.  Patient will increase FOTO score to 61 to demonstrate predicted increase in functional mobility to complete ADLs  Baseline: 36 Goal status: INITIAL  3.  Pt will demonstrate 5TSTS by at least 13 seconds in order to demonstrate age matched LE strength needed for ind transfers  Baseline: 40sec Goal status: INITIAL  4.  Pt will increase 10MWT to at least 1.26m/63mn order to demonstrate community ambulation speed with decreased fall risk   Baseline: self selected: 0.77m/11mstest: 0.8m/s108ml status: INITIAL     PLAN:  PT FREQUENCY: 1-2x/week  PT DURATION: 8 weeks  PLANNED INTERVENTIONS: Therapeutic exercises, Therapeutic activity, Neuromuscular re-education, Balance training, Gait training, Patient/Family education, Self Care,  Joint mobilization, Joint manipulation, Stair training, Dry Needling, and Electrical stimulation  PLAN FOR NEXT SESSION:   ChelseDurwin RegesChelseDurwin Reges/02/2023, 9:13 AM

## 2022-09-19 ENCOUNTER — Encounter: Payer: Self-pay | Admitting: Physical Therapy

## 2022-09-19 ENCOUNTER — Ambulatory Visit: Payer: Medicare PPO | Admitting: Physical Therapy

## 2022-09-19 DIAGNOSIS — M1712 Unilateral primary osteoarthritis, left knee: Secondary | ICD-10-CM | POA: Diagnosis not present

## 2022-09-19 DIAGNOSIS — M25562 Pain in left knee: Secondary | ICD-10-CM | POA: Diagnosis not present

## 2022-09-19 NOTE — Therapy (Signed)
OUTPATIENT PHYSICAL THERAPY LOWER EXTREMITY EVALUATION   Patient Name: Robert [name] MRN: [id number] DOB:[birth year], 82 y.o., male Today's Date: 09/19/2022  END OF SESSION:  PT End of Session - 09/19/22 1253     Visit Number 3    Number of Visits 17    Date for PT Re-Evaluation 11/24/22    Authorization - Visit Number 3    Authorization - Number of Visits 10    Progress Note Due on Visit 10    PT Start Time 1300    PT Stop Time 1340    PT Time Calculation (min) 40 min    Activity Tolerance Patient tolerated treatment well    Behavior During Therapy [facility] for tasks assessed/performed               Past Medical History:  Diagnosis Date   Arthritis    Diverticulosis, sigmoid 08/12/2014   Moderate, noted on colonoscopy   History of adenomatous polyp of colon    Hyperlipidemia    Hypertension    followed by pcp   (03-16-2020  per pt had stress test several years ago, told ok)   Left inguinal hernia    Prostate cancer [facility]) urologist-- dr Junious Silk   dx 2019,  Stage T1c, Gleason 3+4;  s/p  brachytherapy 03-08-2018   Wears glasses    Past Surgical History:  Procedure Laterality Date   COLONOSCOPY W/ POLYPECTOMY  last one 12-18-2019  dr Henrene Pastor   CYSTOSCOPY N/A 03/08/2018   Procedure: CYSTOSCOPY;  Surgeon: Festus Aloe, MD;  Location: [facility];  Service: Urology;  Laterality: N/A;  no seeds in bladder per dr eskridge   INGUINAL HERNIA REPAIR Left 03/19/2020   Procedure: LEFT INGUINAL HERNIA REPAIR;  Surgeon: Kieth Brightly, Arta Bruce, MD;  Location: [facility];  Service: General;  Laterality: Left;   INSERTION OF MESH Left 03/19/2020   Procedure: INSERTION OF MESH;  Surgeon: Kinsinger, Arta Bruce, MD;  Location: [facility];  Service: General;  Laterality: Left;   PROSTATE BIOPSY  10/16/2017   RADIOACTIVE SEED IMPLANT N/A 03/08/2018   Procedure: RADIOACTIVE SEED IMPLANT/BRACHYTHERAPY IMPLANT;  Surgeon: Festus Aloe, MD;   Location: [place];  Service: Urology;  Laterality: N/A;  86 seeds   SPACE OAR INSTILLATION N/A 03/08/2018   Procedure: SPACE OAR INSTILLATION;  Surgeon: Festus Aloe, MD;  Location: [facility];  Service: Urology;  Laterality: N/A;   TONSILLECTOMY  child   TOTAL KNEE ARTHROPLASTY Left 08/25/2022   Procedure: LEFT TOTAL KNEE ARTHROPLASTY;  Surgeon: Mcarthur Rossetti, MD;  Location: [facility];  Service: Orthopedics;  Laterality: Left;   Patient Active Problem List   Diagnosis Date Noted   Status post total left knee replacement 08/25/2022   Unilateral primary osteoarthritis, left knee 06/14/2022   Malignant neoplasm of prostate ([place]) 12/10/2017    PCP: Philip Aspen MD  REFERRING PROVIDER: Philis Fendt MD  REFERRING DIAG: L TKA  THERAPY DIAG:  Acute pain of left knee  Rationale for Evaluation and Treatment: Rehabilitation  ONSET DATE: 08/25/22  SUBJECTIVE:   SUBJECTIVE STATEMENT: Pt reports he has been doing well over the weekend, trying to complete his exercises but that he has not been doing them as much as prescribed. Has walked with SPC at home "a little" but is feeling not super balanced without the RW.  PERTINENT HISTORY: Pt is an 82 year old male presenting s/p L TKA 08/25/22. PLOF ambulation without AD, complete ind with all ADLs. Currently ambulating  with RW in the home and community. Lives at home with wife, with family in the area. His daughter is staying with him to make meals, clean, drive, etc. Reports he is dressing himself though he does need assistance from daughter to don/doff ted hose. Home with ramped entry, single level, walk in shower with seat and grab bars, and standard height toilet seat. Current pain level 5/10 best: /10 worst 8/10. Pt has had HHPT since surgery, and is doing mini squats, and walking down the hall at home. Is taking oxycodone as needed for pain. No falls in past 6 months. Pt denies N/V, B&B changes, unexplained  weight fluctuation, saddle paresthesia, fever, night sweats, or unrelenting night pain at this time.   PAIN:  Are you having pain? Yes: NPRS scale: 5/10 Pain location: L knee Pain description: aching Aggravating factors: standing, walking Relieving factors: rest  PRECAUTIONS: None  WEIGHT BEARING RESTRICTIONS: No  FALLS:  Has patient fallen in last 6 months? No  LIVING ENVIRONMENT: Lives with: lives with their family Lives in: House/apartment Stairs: No Has following equipment at home: Single point cane, Environmental consultant - 2 wheeled, Electronics engineer, Grab bars, and Ramped entry  OCCUPATION: Retired  PLOF: Independent with basic ADLs  PATIENT GOALS: Be able to walk without RW  NEXT MD VISIT: 09/27/22  OBJECTIVE:   DIAGNOSTIC FINDINGS:   PATIENT SURVEYS:  FOTO 36 goal 56  COGNITION: Overall cognitive status: Within functional limits for tasks assessed     SENSATION: WFL  EDEMA:  Circumferential: L mid patella 46cm; R mid patell 41cm  MUSCLE LENGTH: Hamstrings: shortened bilat Thomas test: shortened RLE; unable to test on L  POSTURE: rounded shoulders, forward head, flexed trunk , and weight shift right  PALPATION: Swelling inhibiting patellar movement; ttp diffuse of L knee  LOWER EXTREMITY ROM:  Active ROM Right eval Left eval  Hip flexion 110/110 90/110  Hip extension ~5/10 bilat  Hip abduction    Hip adduction    Hip internal rotation 50% limited bilat  Hip external rotation    Knee flexion 128 75/78  Knee extension 0 25/22  Ankle dorsiflexion 15 14  Ankle plantarflexion    Ankle inversion    Ankle eversion     (Blank rows = not tested)  LOWER EXTREMITY MMT:  MMT Right eval Left eval  Hip flexion 4+ 2+  Hip extension 3- 2+  Hip abduction 4- 3+  Hip adduction    Hip internal rotation 5 4+  Hip external rotation 4+ 4  Knee flexion 4+ 3+  Knee extension 4+ 3  Ankle dorsiflexion 4+ 4+  Ankle plantarflexion    Ankle inversion    Ankle eversion      (Blank rows = not tested)   FUNCTIONAL TESTS:  5 times sit to stand: 40sec with UE use for push off 10 meter walk test: self selected: 0.33ms fastest: 0.444m SLS unable bilat  GAIT: Distance walked: 6M Assistive device utilized: WaEnvironmental consultant 2 wheeled Level of assistance: Modified independence Comments: decreased L stance time; decreased RLE step length, heavy reliance on RW with RLE swing    TODAY'S TREATMENT:  DATE: 09/19/22 recumbant bike AAROM knee flex/ext seat 15 51mns Seated heel slides x12 with 3-5sec hold with cuing for knee ext as well as flex Heel prop stretch 45sec (to tolerance) with mobilization x45sec; active rest with 154m patellar mobilization all directions between sets (2x) STS from elevated mat with RLE on 2in step 2x 6 with min cuing for full stand with good carry over with  Quad set x12 3sec hold supine LAQ in available range 2x 12 cuing for ankle DF with good carry over Standing  Amb [id number] SPC CGA for safety with good safety, decent carry over of gait notes  PATIENT EDUCATION:  Education details: Patient was educated on diagnosis, anatomy and pathology involved, prognosis, role of PT, and was given an HEP, demonstrating exercise with proper form following verbal and tactile cues, and was given a paper hand out to continue exercise at home. Pt was educated on and agreed to plan of care.  Person educated: Patient Education method: Explanation, Demonstration, and Verbal cues Education comprehension: verbalized understanding, returned demonstration, and verbal cues required  HOME EXERCISE PROGRAM: [id number]  ASSESSMENT:  CLINICAL IMPRESSION: PT continued  therex progression for increased knee mobility and LLE strength with success. Pt with increased tolerance to therex this session without pain response. Pt with evident increase in  mobility - not formally measured. Pt able to comply with all cuing for proper technique of therex. Would benefit from skilled PT to address above deficits and promote optimal return to PLOF.    OBJECTIVE IMPAIRMENTS: carrying, lifting, bending, standing, squatting, stairs, transfers, bed mobility, bathing, dressing, hygiene/grooming, and caring for others.   ACTIVITY LIMITATIONS: carrying, lifting, bending, sitting, standing, squatting, sleeping, transfers, bed mobility, bathing, toileting, and locomotion level  PARTICIPATION LIMITATIONS: meal prep, cleaning, laundry, shopping, community activity, and yard work  PERSONAL FACTORS: Age, Fitness, Past/current experiences, Time since onset of injury/illness/exacerbation, and 3+ comorbidities: HLD, HTN, athritis, prostate cancer  are also affecting patient's functional outcome.   REHAB POTENTIAL: Good  CLINICAL DECISION MAKING: Evolving/moderate complexity  EVALUATION COMPLEXITY: Moderate   GOALS: Goals reviewed with patient? Yes  SHORT TERM GOALS: Target date: 10/08/22 Pt will be independent with HEP in order to improve strength and balance in order to decrease fall risk and improve function at home and work.  Baseline: HEP given Goal status: INITIAL   LONG TERM GOALS: Target date: 11/17/22  Pt will demonstrate L knee mobility of 0-120d in order to demonstrate mobility needed for normalized gait Baseline: 22-75 Goal status: INITIAL  2.  Patient will increase FOTO score to 61 to demonstrate predicted increase in functional mobility to complete ADLs  Baseline: 36 Goal status: INITIAL  3.  Pt will demonstrate 5TSTS by at least 13 seconds in order to demonstrate age matched LE strength needed for ind transfers  Baseline: 40sec Goal status: INITIAL  4.  Pt will increase 10MWT to at least 1.96m/44mn order to demonstrate community ambulation speed with decreased fall risk   Baseline: self selected: 0.28m/33mstest: 0.65m/s48ml  status: INITIAL     PLAN:  PT FREQUENCY: 1-2x/week  PT DURATION: 8 weeks  PLANNED INTERVENTIONS: Therapeutic exercises, Therapeutic activity, Neuromuscular re-education, Balance training, Gait training, Patient/Family education, Self Care, Joint mobilization, Joint manipulation, Stair training, Dry Needling, and Electrical stimulation  PLAN FOR NEXT SESSION:   ChelseDurwin RegesChelseDurwin Reges/06/2023, 1:45 PM

## 2022-09-21 ENCOUNTER — Ambulatory Visit: Payer: Medicare PPO

## 2022-09-21 DIAGNOSIS — M1712 Unilateral primary osteoarthritis, left knee: Secondary | ICD-10-CM | POA: Diagnosis not present

## 2022-09-21 DIAGNOSIS — M25562 Pain in left knee: Secondary | ICD-10-CM

## 2022-09-21 NOTE — Therapy (Signed)
OUTPATIENT PHYSICAL THERAPY LOWER EXTREMITY TREATMENT   Patient Name: Robert [name] MRN: [id number] DOB:[birth year], 82 y.o., male Today's Date: 09/21/2022  END OF SESSION:  PT End of Session - 09/21/22 0827     Visit Number 4    Number of Visits 17    Date for PT Re-Evaluation 11/24/22    Authorization - Number of Visits 10    Progress Note Due on Visit 10    PT Start Time 0830    PT Stop Time 0912    PT Time Calculation (min) 42 min    Equipment Utilized During Treatment Gait belt    Activity Tolerance Patient tolerated treatment well    Behavior During Therapy WFL for tasks assessed/performed               Past Medical History:  Diagnosis Date   Arthritis    Diverticulosis, sigmoid 08/12/2014   Moderate, noted on colonoscopy   History of adenomatous polyp of colon    Hyperlipidemia    Hypertension    followed by pcp   (03-16-2020  per pt had stress test several years ago, told ok)   Left inguinal hernia    Prostate cancer [facility]) urologist-- dr Junious Silk   dx 2019,  Stage T1c, Gleason 3+4;  s/p  brachytherapy 03-08-2018   Wears glasses    Past Surgical History:  Procedure Laterality Date   COLONOSCOPY W/ POLYPECTOMY  last one 12-18-2019  dr Henrene Pastor   CYSTOSCOPY N/A 03/08/2018   Procedure: CYSTOSCOPY;  Surgeon: Festus Aloe, MD;  Location: [facility];  Service: Urology;  Laterality: N/A;  no seeds in bladder per dr eskridge   INGUINAL HERNIA REPAIR Left 03/19/2020   Procedure: LEFT INGUINAL HERNIA REPAIR;  Surgeon: Kieth Brightly, Arta Bruce, MD;  Location: [facility];  Service: General;  Laterality: Left;   INSERTION OF MESH Left 03/19/2020   Procedure: INSERTION OF MESH;  Surgeon: Kinsinger, Arta Bruce, MD;  Location: [facility];  Service: General;  Laterality: Left;   PROSTATE BIOPSY  10/16/2017   RADIOACTIVE SEED IMPLANT N/A 03/08/2018   Procedure: RADIOACTIVE SEED IMPLANT/BRACHYTHERAPY IMPLANT;  Surgeon: Festus Aloe, MD;  Location: [place];  Service: Urology;  Laterality: N/A;  86 seeds   SPACE OAR INSTILLATION N/A 03/08/2018   Procedure: SPACE OAR INSTILLATION;  Surgeon: Festus Aloe, MD;  Location: [facility];  Service: Urology;  Laterality: N/A;   TONSILLECTOMY  child   TOTAL KNEE ARTHROPLASTY Left 08/25/2022   Procedure: LEFT TOTAL KNEE ARTHROPLASTY;  Surgeon: Mcarthur Rossetti, MD;  Location: [facility];  Service: Orthopedics;  Laterality: Left;   Patient Active Problem List   Diagnosis Date Noted   Status post total left knee replacement 08/25/2022   Unilateral primary osteoarthritis, left knee 06/14/2022   Malignant neoplasm of prostate ([place]) 12/10/2017    PCP: Philip Aspen MD  REFERRING PROVIDER: Philis Fendt MD  REFERRING DIAG: L TKA  THERAPY DIAG:  Acute pain of left knee  Rationale for Evaluation and Treatment: Rehabilitation  ONSET DATE: 08/25/22  SUBJECTIVE:   SUBJECTIVE STATEMENT: Pt is motivated to participate. Reports not sleeping well due to his muscles couldn't "relax". Pain in L knee today 5/10 NPS.  PERTINENT HISTORY: Pt is an 82 year old male presenting s/p L TKA 08/25/22. PLOF ambulation without AD, complete ind with all ADLs. Currently ambulating with RW in the home and community. Lives at home with wife, with family in the area. His daughter is staying  with him to make meals, clean, drive, etc. Reports he is dressing himself though he does need assistance from daughter to don/doff ted hose. Home with ramped entry, single level, walk in shower with seat and grab bars, and standard height toilet seat. Current pain level 5/10 best: /10 worst 8/10. Pt has had HHPT since surgery, and is doing mini squats, and walking down the hall at home. Is taking oxycodone as needed for pain. No falls in past 6 months. Pt denies N/V, B&B changes, unexplained weight fluctuation, saddle paresthesia, fever, night sweats, or unrelenting night pain at this  time.   PAIN:  Are you having pain? Yes: NPRS scale: 5/10 Pain location: L knee Pain description: aching Aggravating factors: standing, walking Relieving factors: rest  PRECAUTIONS: None  WEIGHT BEARING RESTRICTIONS: No  FALLS:  Has patient fallen in last 6 months? No  LIVING ENVIRONMENT: Lives with: lives with their family Lives in: House/apartment Stairs: No Has following equipment at home: Single point cane, Environmental consultant - 2 wheeled, Electronics engineer, Grab bars, and Ramped entry  OCCUPATION: Retired  PLOF: Independent with basic ADLs  PATIENT GOALS: Be able to walk without RW  NEXT MD VISIT: 09/27/22  OBJECTIVE:   DIAGNOSTIC FINDINGS:   PATIENT SURVEYS:  FOTO 36 goal 42  COGNITION: Overall cognitive status: Within functional limits for tasks assessed     SENSATION: WFL  EDEMA:  Circumferential: L mid patella 46cm; R mid patell 41cm  MUSCLE LENGTH: Hamstrings: shortened bilat Thomas test: shortened RLE; unable to test on L  POSTURE: rounded shoulders, forward head, flexed trunk , and weight shift right  PALPATION: Swelling inhibiting patellar movement; ttp diffuse of L knee  LOWER EXTREMITY ROM:  Active ROM Right eval Left eval  Hip flexion 110/110 90/110  Hip extension ~5/10 bilat  Hip abduction    Hip adduction    Hip internal rotation 50% limited bilat  Hip external rotation    Knee flexion 128 75/78  Knee extension 0 25/22  Ankle dorsiflexion 15 14  Ankle plantarflexion    Ankle inversion    Ankle eversion     (Blank rows = not tested)  LOWER EXTREMITY MMT:  MMT Right eval Left eval  Hip flexion 4+ 2+  Hip extension 3- 2+  Hip abduction 4- 3+  Hip adduction    Hip internal rotation 5 4+  Hip external rotation 4+ 4  Knee flexion 4+ 3+  Knee extension 4+ 3  Ankle dorsiflexion 4+ 4+  Ankle plantarflexion    Ankle inversion    Ankle eversion     (Blank rows = not tested)   FUNCTIONAL TESTS:  5 times sit to stand: 40sec with UE use  for push off 10 meter walk test: self selected: 0.68ms fastest: 0.446m SLS unable bilat  GAIT: Distance walked: 48M Assistive device utilized: WaEnvironmental consultant 2 wheeled Level of assistance: Modified independence Comments: decreased L stance time; decreased RLE step length, heavy reliance on RW with RLE swing    TODAY'S TREATMENT:  DATE: 09/21/22  Recumbent bike AAROM knee flex/ext seat 15, 5 mins  Seated heel slides x12 with 3-5sec hold with cuing for knee ext as well as flex  Heel prop stretch with towel roll under heel. 3x30 seconds. Grade 4 patellar mobs in all planes b/t sets, x5/direction.    Quad set: 1x6, 2-3 sec holds  STS from elevated mat with RLE on 2 in step 2 x 6 with min cuing for R knee extension.  Ambulating with SPC in RUE. 2x100'. Seated rest b/t bouts. VC's for consistent heel strike with LLE with good carryover.    PATIENT EDUCATION:  Education details: Patient was educated on diagnosis, anatomy and pathology involved, prognosis, role of PT, and was given an HEP, demonstrating exercise with proper form following verbal and tactile cues, and was given a paper hand out to continue exercise at home. Pt was educated on and agreed to plan of care.  Person educated: Patient Education method: Explanation, Demonstration, and Verbal cues Education comprehension: verbalized understanding, returned demonstration, and verbal cues required  HOME EXERCISE PROGRAM: [id number]  ASSESSMENT:  CLINICAL IMPRESSION: Continuing PT POC with focus on improving L knee AROM and LE strengthening. Pt progressing in gait tolerance with SPC. Pt able to perform consistent heel strike with gait progressing from CGA to supervision. Still generally unsteady with SPC but able to sequence correctly. Encouraged use of RW in home still performing consistent L heel strike to  improve L TKE and quad activation for stability in stance phase. Pt understanding. Pt will continue to benefit from skilled PT services to address remaining deficits in L knee to reduce falls risk and return to PLOF.     OBJECTIVE IMPAIRMENTS: carrying, lifting, bending, standing, squatting, stairs, transfers, bed mobility, bathing, dressing, hygiene/grooming, and caring for others.   ACTIVITY LIMITATIONS: carrying, lifting, bending, sitting, standing, squatting, sleeping, transfers, bed mobility, bathing, toileting, and locomotion level  PARTICIPATION LIMITATIONS: meal prep, cleaning, laundry, shopping, community activity, and yard work  PERSONAL FACTORS: Age, Fitness, Past/current experiences, Time since onset of injury/illness/exacerbation, and 3+ comorbidities: HLD, HTN, athritis, prostate cancer  are also affecting patient's functional outcome.   REHAB POTENTIAL: Good  CLINICAL DECISION MAKING: Evolving/moderate complexity  EVALUATION COMPLEXITY: Moderate   GOALS: Goals reviewed with patient? Yes  SHORT TERM GOALS: Target date: 10/08/22 Pt will be independent with HEP in order to improve strength and balance in order to decrease fall risk and improve function at home and work.  Baseline: HEP given Goal status: INITIAL   LONG TERM GOALS: Target date: 11/17/22  Pt will demonstrate L knee mobility of 0-120d in order to demonstrate mobility needed for normalized gait Baseline: 22-75 Goal status: INITIAL  2.  Patient will increase FOTO score to 61 to demonstrate predicted increase in functional mobility to complete ADLs  Baseline: 36 Goal status: INITIAL  3.  Pt will demonstrate 5TSTS by at least 13 seconds in order to demonstrate age matched LE strength needed for ind transfers  Baseline: 40sec Goal status: INITIAL  4.  Pt will increase 10MWT to at least 1.45ms in order to demonstrate community ambulation speed with decreased fall risk   Baseline: self selected: 0.3104m  fastest: 0.4254mGoal status: INITIAL     PLAN:  PT FREQUENCY: 1-2x/week  PT DURATION: 8 weeks  PLANNED INTERVENTIONS: Therapeutic exercises, Therapeutic activity, Neuromuscular re-education, Balance training, Gait training, Patient/Family education, Self Care, Joint mobilization, Joint manipulation, Stair training, Dry Needling, and Electrical stimulation  PLAN FOR NEXT SESSION: SPC progression and  L knee AROM/strengthening   Salem Caster. Fairly IV, PT, DPT Physical Therapist- [facility]  09/21/2022, 8:55 AM

## 2022-09-26 ENCOUNTER — Encounter: Payer: Self-pay | Admitting: Physical Therapy

## 2022-09-26 ENCOUNTER — Ambulatory Visit: Payer: Medicare PPO | Admitting: Physical Therapy

## 2022-09-26 DIAGNOSIS — M25562 Pain in left knee: Secondary | ICD-10-CM | POA: Diagnosis not present

## 2022-09-26 DIAGNOSIS — M1712 Unilateral primary osteoarthritis, left knee: Secondary | ICD-10-CM | POA: Diagnosis not present

## 2022-09-26 NOTE — Therapy (Signed)
OUTPATIENT PHYSICAL THERAPY LOWER EXTREMITY TREATMENT   Patient Name: Robert [name] MRN: [id number] DOB:[birth year], 82 y.o., male Today's Date: 09/26/2022  END OF SESSION:  PT End of Session - 09/26/22 0836     Visit Number 5    Number of Visits 17    Date for PT Re-Evaluation 11/24/22    Authorization - Visit Number 5    Authorization - Number of Visits 10    Progress Note Due on Visit 10    PT Start Time 0833    PT Stop Time 0911    PT Time Calculation (min) 38 min    Equipment Utilized During Treatment Gait belt    Activity Tolerance Patient tolerated treatment well    Behavior During Therapy WFL for tasks assessed/performed                Past Medical History:  Diagnosis Date   Arthritis    Diverticulosis, sigmoid 08/12/2014   Moderate, noted on colonoscopy   History of adenomatous polyp of colon    Hyperlipidemia    Hypertension    followed by pcp   (03-16-2020  per pt had stress test several years ago, told ok)   Left inguinal hernia    Prostate cancer [facility]) urologist-- dr Junious Silk   dx 2019,  Stage T1c, Gleason 3+4;  s/p  brachytherapy 03-08-2018   Wears glasses    Past Surgical History:  Procedure Laterality Date   COLONOSCOPY W/ POLYPECTOMY  last one 12-18-2019  dr Henrene Pastor   CYSTOSCOPY N/A 03/08/2018   Procedure: CYSTOSCOPY;  Surgeon: Festus Aloe, MD;  Location: [facility];  Service: Urology;  Laterality: N/A;  no seeds in bladder per dr eskridge   INGUINAL HERNIA REPAIR Left 03/19/2020   Procedure: LEFT INGUINAL HERNIA REPAIR;  Surgeon: Kieth Brightly, Arta Bruce, MD;  Location: [facility];  Service: General;  Laterality: Left;   INSERTION OF MESH Left 03/19/2020   Procedure: INSERTION OF MESH;  Surgeon: Kinsinger, Arta Bruce, MD;  Location: [facility];  Service: General;  Laterality: Left;   PROSTATE BIOPSY  10/16/2017   RADIOACTIVE SEED IMPLANT N/A 03/08/2018   Procedure: RADIOACTIVE SEED  IMPLANT/BRACHYTHERAPY IMPLANT;  Surgeon: Festus Aloe, MD;  Location: [place];  Service: Urology;  Laterality: N/A;  86 seeds   SPACE OAR INSTILLATION N/A 03/08/2018   Procedure: SPACE OAR INSTILLATION;  Surgeon: Festus Aloe, MD;  Location: [facility];  Service: Urology;  Laterality: N/A;   TONSILLECTOMY  child   TOTAL KNEE ARTHROPLASTY Left 08/25/2022   Procedure: LEFT TOTAL KNEE ARTHROPLASTY;  Surgeon: Mcarthur Rossetti, MD;  Location: [facility];  Service: Orthopedics;  Laterality: Left;   Patient Active Problem List   Diagnosis Date Noted   Status post total left knee replacement 08/25/2022   Unilateral primary osteoarthritis, left knee 06/14/2022   Malignant neoplasm of prostate ([place]) 12/10/2017    PCP: Philip Aspen MD  REFERRING PROVIDER: Philis Fendt MD  REFERRING DIAG: L TKA  THERAPY DIAG:  Acute pain of left knee  Rationale for Evaluation and Treatment: Rehabilitation  ONSET DATE: 08/25/22  SUBJECTIVE:   SUBJECTIVE STATEMENT: Pt is motivated to participate. Reports not sleeping well due to his muscles couldn't "relax". Pain in L knee today 5/10 NPS.  PERTINENT HISTORY: Pt is an 82 year old male presenting s/p L TKA 08/25/22. PLOF ambulation without AD, complete ind with all ADLs. Currently ambulating with RW in the home and community. Lives at home with wife,  with family in the area. His daughter is staying with him to make meals, clean, drive, etc. Reports he is dressing himself though he does need assistance from daughter to don/doff ted hose. Home with ramped entry, single level, walk in shower with seat and grab bars, and standard height toilet seat. Current pain level 5/10 best: /10 worst 8/10. Pt has had HHPT since surgery, and is doing mini squats, and walking down the hall at home. Is taking oxycodone as needed for pain. No falls in past 6 months. Pt denies N/V, B&B changes, unexplained weight fluctuation, saddle paresthesia, fever,  night sweats, or unrelenting night pain at this time.   PAIN:  Are you having pain? Yes: NPRS scale: 5/10 Pain location: L knee Pain description: aching Aggravating factors: standing, walking Relieving factors: rest  PRECAUTIONS: None  WEIGHT BEARING RESTRICTIONS: No  FALLS:  Has patient fallen in last 6 months? No  LIVING ENVIRONMENT: Lives with: lives with their family Lives in: House/apartment Stairs: No Has following equipment at home: Single point cane, Environmental consultant - 2 wheeled, Electronics engineer, Grab bars, and Ramped entry  OCCUPATION: Retired  PLOF: Independent with basic ADLs  PATIENT GOALS: Be able to walk without RW  NEXT MD VISIT: 09/27/22  OBJECTIVE:   DIAGNOSTIC FINDINGS:   PATIENT SURVEYS:  FOTO 36 goal 70  COGNITION: Overall cognitive status: Within functional limits for tasks assessed     SENSATION: WFL  EDEMA:  Circumferential: L mid patella 46cm; R mid patell 41cm  MUSCLE LENGTH: Hamstrings: shortened bilat Thomas test: shortened RLE; unable to test on L  POSTURE: rounded shoulders, forward head, flexed trunk , and weight shift right  PALPATION: Swelling inhibiting patellar movement; ttp diffuse of L knee  LOWER EXTREMITY ROM:  Active ROM Right eval Left eval  Hip flexion 110/110 90/110  Hip extension ~5/10 bilat  Hip abduction    Hip adduction    Hip internal rotation 50% limited bilat  Hip external rotation    Knee flexion 128 75/78  Knee extension 0 25/22  Ankle dorsiflexion 15 14  Ankle plantarflexion    Ankle inversion    Ankle eversion     (Blank rows = not tested)  LOWER EXTREMITY MMT:  MMT Right eval Left eval  Hip flexion 4+ 2+  Hip extension 3- 2+  Hip abduction 4- 3+  Hip adduction    Hip internal rotation 5 4+  Hip external rotation 4+ 4  Knee flexion 4+ 3+  Knee extension 4+ 3  Ankle dorsiflexion 4+ 4+  Ankle plantarflexion    Ankle inversion    Ankle eversion     (Blank rows = not tested)   FUNCTIONAL  TESTS:  5 times sit to stand: 40sec with UE use for push off 10 meter walk test: self selected: 0.77m/s fastest: 0.55m/s SLS unable bilat  GAIT: Distance walked: 56M Assistive device utilized: Environmental consultant - 2 wheeled Level of assistance: Modified independence Comments: decreased L stance time; decreased RLE step length, heavy reliance on RW with RLE swing    TODAY'S TREATMENT:  DATE: 09/21/22  Recumbent bike AAROM knee flex/ext seat 14>12, 6 mins  Seated heel slides x12 with 3-5sec hold with cuing for knee ext as well as flex  Heel prop stretch with bolster roll under heel. 2x60seconds. Grade 3 AP femur mob 30sec x2 between stretches  Quad set: 1x12, 2-3 sec holds - minimal quad activation   SLR x10 with min quad lag maintained throughout  STS from elevated mat with RLE on foam pad 2x 10 with min cuing for R knee extension.  LLE on stair flex stretch x12 3secH  Ambulating with SPC in RUE. 2x100'. Seated rest b/t bouts. VC's for consistent heel strike with LLE with good carryover.    PATIENT EDUCATION:  Education details: Patient was educated on diagnosis, anatomy and pathology involved, prognosis, role of PT, and was given an HEP, demonstrating exercise with proper form following verbal and tactile cues, and was given a paper hand out to continue exercise at home. Pt was educated on and agreed to plan of care.  Person educated: Patient Education method: Explanation, Demonstration, and Verbal cues Education comprehension: verbalized understanding, returned demonstration, and verbal cues required  HOME EXERCISE PROGRAM: [id number]  ASSESSMENT:  CLINICAL IMPRESSION: Continuing PT POC with focus on improving L knee AROM and LE strengthening. Pt is able to comply with all cuing for proper technique of therex with good motivation and no increased pain throughout  session. Patient with most difficulty with knee ext and quad activation, encouraged to complete prop stretch as often as possible at home.  Pt will continue to benefit from skilled PT services to address remaining deficits in L knee to reduce falls risk and return to PLOF.     OBJECTIVE IMPAIRMENTS: carrying, lifting, bending, standing, squatting, stairs, transfers, bed mobility, bathing, dressing, hygiene/grooming, and caring for others.   ACTIVITY LIMITATIONS: carrying, lifting, bending, sitting, standing, squatting, sleeping, transfers, bed mobility, bathing, toileting, and locomotion level  PARTICIPATION LIMITATIONS: meal prep, cleaning, laundry, shopping, community activity, and yard work  PERSONAL FACTORS: Age, Fitness, Past/current experiences, Time since onset of injury/illness/exacerbation, and 3+ comorbidities: HLD, HTN, athritis, prostate cancer  are also affecting patient's functional outcome.   REHAB POTENTIAL: Good  CLINICAL DECISION MAKING: Evolving/moderate complexity  EVALUATION COMPLEXITY: Moderate   GOALS: Goals reviewed with patient? Yes  SHORT TERM GOALS: Target date: 10/08/22 Pt will be independent with HEP in order to improve strength and balance in order to decrease fall risk and improve function at home and work.  Baseline: HEP given Goal status: INITIAL   LONG TERM GOALS: Target date: 11/17/22  Pt will demonstrate L knee mobility of 0-120d in order to demonstrate mobility needed for normalized gait Baseline: 22-75 Goal status: INITIAL  2.  Patient will increase FOTO score to 61 to demonstrate predicted increase in functional mobility to complete ADLs  Baseline: 36 Goal status: INITIAL  3.  Pt will demonstrate 5TSTS by at least 13 seconds in order to demonstrate age matched LE strength needed for ind transfers  Baseline: 40sec Goal status: INITIAL  4.  Pt will increase 10MWT to at least 1.31m/s in order to demonstrate community ambulation speed with  decreased fall risk   Baseline: self selected: 0.79m/s fastest: 0.61m/s Goal status: INITIAL     PLAN:  PT FREQUENCY: 1-2x/week  PT DURATION: 8 weeks  PLANNED INTERVENTIONS: Therapeutic exercises, Therapeutic activity, Neuromuscular re-education, Balance training, Gait training, Patient/Family education, Self Care, Joint mobilization, Joint manipulation, Stair training, Dry Needling, and Electrical stimulation  PLAN FOR NEXT SESSION:  SPC progression and L knee AROM/strengthening   Salem Caster. Fairly IV, PT, DPT Physical Therapist- [facility]  09/26/2022, 11:05 AM

## 2022-09-29 ENCOUNTER — Ambulatory Visit: Payer: Medicare PPO | Admitting: Physical Therapy

## 2022-09-29 ENCOUNTER — Encounter: Payer: Self-pay | Admitting: Physical Therapy

## 2022-09-29 DIAGNOSIS — M1712 Unilateral primary osteoarthritis, left knee: Secondary | ICD-10-CM | POA: Diagnosis not present

## 2022-09-29 DIAGNOSIS — M25562 Pain in left knee: Secondary | ICD-10-CM

## 2022-09-29 NOTE — Therapy (Signed)
OUTPATIENT PHYSICAL THERAPY LOWER EXTREMITY TREATMENT   Patient Name: Robert [name] MRN: [id number] DOB:07-14-1940, 82 y.o., male Today's Date: 09/29/2022  END OF SESSION:  PT End of Session - 09/29/22 0834     Visit Number 6    Number of Visits 17    Authorization - Visit Number 6    Authorization - Number of Visits 10    Progress Note Due on Visit 10    PT Start Time 0833    PT Stop Time 0911    PT Time Calculation (min) 38 min    Activity Tolerance Patient tolerated treatment well    Behavior During Therapy [facility] for tasks assessed/performed                 Past Medical History:  Diagnosis Date   Arthritis    Diverticulosis, sigmoid 08/12/2014   Moderate, noted on colonoscopy   History of adenomatous polyp of colon    Hyperlipidemia    Hypertension    followed by pcp   (03-16-2020  per pt had stress test several years ago, told ok)   Left inguinal hernia    Prostate cancer [facility]) urologist-- dr Junious Silk   dx 2019,  Stage T1c, Gleason 3+4;  s/p  brachytherapy 03-08-2018   Wears glasses    Past Surgical History:  Procedure Laterality Date   COLONOSCOPY W/ POLYPECTOMY  last one 12-18-2019  dr Henrene Pastor   CYSTOSCOPY N/A 03/08/2018   Procedure: CYSTOSCOPY;  Surgeon: Festus Aloe, MD;  Location: [facility];  Service: Urology;  Laterality: N/A;  no seeds in bladder per dr eskridge   INGUINAL HERNIA REPAIR Left 03/19/2020   Procedure: LEFT INGUINAL HERNIA REPAIR;  Surgeon: Kieth Brightly, Arta Bruce, MD;  Location: [facility];  Service: General;  Laterality: Left;   INSERTION OF MESH Left 03/19/2020   Procedure: INSERTION OF MESH;  Surgeon: Kinsinger, Arta Bruce, MD;  Location: [facility];  Service: General;  Laterality: Left;   PROSTATE BIOPSY  10/16/2017   RADIOACTIVE SEED IMPLANT N/A 03/08/2018   Procedure: RADIOACTIVE SEED IMPLANT/BRACHYTHERAPY IMPLANT;  Surgeon: Festus Aloe, MD;  Location: [place];   Service: Urology;  Laterality: N/A;  86 seeds   SPACE OAR INSTILLATION N/A 03/08/2018   Procedure: SPACE OAR INSTILLATION;  Surgeon: Festus Aloe, MD;  Location: [facility];  Service: Urology;  Laterality: N/A;   TONSILLECTOMY  child   TOTAL KNEE ARTHROPLASTY Left 08/25/2022   Procedure: LEFT TOTAL KNEE ARTHROPLASTY;  Surgeon: Mcarthur Rossetti, MD;  Location: [facility];  Service: Orthopedics;  Laterality: Left;   Patient Active Problem List   Diagnosis Date Noted   Status post total left knee replacement 08/25/2022   Unilateral primary osteoarthritis, left knee 06/14/2022   Malignant neoplasm of prostate ([place]) 12/10/2017    PCP: Philip Aspen MD  REFERRING PROVIDER: Philis Fendt MD  REFERRING DIAG: L TKA  THERAPY DIAG:  Acute pain of left knee  Rationale for Evaluation and Treatment: Rehabilitation  ONSET DATE: 08/25/22  SUBJECTIVE:   SUBJECTIVE STATEMENT: Pt reports 3/10 pain, more soreness> pain this morning. Patient reports he doesn't feel like he needs his walker as much as home, that he is putting less weight on his hands.   PERTINENT HISTORY: Pt is an 82 year old male presenting s/p L TKA 08/25/22. PLOF ambulation without AD, complete ind with all ADLs. Currently ambulating with RW in the home and community. Lives at home with wife, with family in the area.  His daughter is staying with him to make meals, clean, drive, etc. Reports he is dressing himself though he does need assistance from daughter to don/doff ted hose. Home with ramped entry, single level, walk in shower with seat and grab bars, and standard height toilet seat. Current pain level 5/10 best: /10 worst 8/10. Pt has had HHPT since surgery, and is doing mini squats, and walking down the hall at home. Is taking oxycodone as needed for pain. No falls in past 6 months. Pt denies N/V, B&B changes, unexplained weight fluctuation, saddle paresthesia, fever, night sweats, or unrelenting night pain at this  time.   PAIN:  Are you having pain? Yes: NPRS scale: 5/10 Pain location: L knee Pain description: aching Aggravating factors: standing, walking Relieving factors: rest  PRECAUTIONS: None  WEIGHT BEARING RESTRICTIONS: No  FALLS:  Has patient fallen in last 6 months? No  LIVING ENVIRONMENT: Lives with: lives with their family Lives in: House/apartment Stairs: No Has following equipment at home: Single point cane, Environmental consultant - 2 wheeled, Electronics engineer, Grab bars, and Ramped entry  OCCUPATION: Retired  PLOF: Independent with basic ADLs  PATIENT GOALS: Be able to walk without RW  NEXT MD VISIT: 09/27/22  OBJECTIVE:   DIAGNOSTIC FINDINGS:   PATIENT SURVEYS:  FOTO 36 goal 68  COGNITION: Overall cognitive status: Within functional limits for tasks assessed     SENSATION: WFL  EDEMA:  Circumferential: L mid patella 46cm; R mid patell 41cm  MUSCLE LENGTH: Hamstrings: shortened bilat Thomas test: shortened RLE; unable to test on L  POSTURE: rounded shoulders, forward head, flexed trunk , and weight shift right  PALPATION: Swelling inhibiting patellar movement; ttp diffuse of L knee  LOWER EXTREMITY ROM:  Active ROM Right eval Left eval  Hip flexion 110/110 90/110  Hip extension ~5/10 bilat  Hip abduction    Hip adduction    Hip internal rotation 50% limited bilat  Hip external rotation    Knee flexion 128 75/78  Knee extension 0 25/22  Ankle dorsiflexion 15 14  Ankle plantarflexion    Ankle inversion    Ankle eversion     (Blank rows = not tested)  LOWER EXTREMITY MMT:  MMT Right eval Left eval  Hip flexion 4+ 2+  Hip extension 3- 2+  Hip abduction 4- 3+  Hip adduction    Hip internal rotation 5 4+  Hip external rotation 4+ 4  Knee flexion 4+ 3+  Knee extension 4+ 3  Ankle dorsiflexion 4+ 4+  Ankle plantarflexion    Ankle inversion    Ankle eversion     (Blank rows = not tested)   FUNCTIONAL TESTS:  5 times sit to stand: 40sec with UE use  for push off 10 meter walk test: self selected: 0.27m/s fastest: 0.60m/s SLS unable bilat  GAIT: Distance walked: 65M Assistive device utilized: Environmental consultant - 2 wheeled Level of assistance: Modified independence Comments: decreased L stance time; decreased RLE step length, heavy reliance on RW with RLE swing    TODAY'S TREATMENT:  DATE: 09/21/22  Recumbent bike AAROM knee flex/ext seat 13>11, 6 mins   Heel prop stretch with bolster roll under heel 5# AW at distal femur 2x60seconds. Grade 3 AP femur mob 30sec x2 between stretches Prone hanging off edge of table x60sec  SLR 2x 10 with min quad lag maintained throughout  OMEGA leg press 10# x10 with cuing for full knee ext with good carry over  OMEGA eccentric hamstring curl 15# 2x 10/8 with good carry over of cuing for eccentric control  Ambulating without AD in x100'. Seated rest b/t bouts. VC's for consistent heel strike with LLE with good carryover.    PATIENT EDUCATION:  Education details: Patient was educated on diagnosis, anatomy and pathology involved, prognosis, role of PT, and was given an HEP, demonstrating exercise with proper form following verbal and tactile cues, and was given a paper hand out to continue exercise at home. Pt was educated on and agreed to plan of care.  Person educated: Patient Education method: Explanation, Demonstration, and Verbal cues Education comprehension: verbalized understanding, returned demonstration, and verbal cues required  HOME EXERCISE PROGRAM: [id number]  ASSESSMENT:  CLINICAL IMPRESSION: Continuing PT POC with focus on improving L knee AROM and LE strengthening. Pt is able to comply with all cuing for proper technique of therex with good motivation and no increased pain throughout session. Patient with most difficulty with knee ext and quad activation, encouraged  to complete prop stretch as often as possible at home.  Pt will continue to benefit from skilled PT services to address remaining deficits in L knee to reduce falls risk and return to PLOF.     OBJECTIVE IMPAIRMENTS: carrying, lifting, bending, standing, squatting, stairs, transfers, bed mobility, bathing, dressing, hygiene/grooming, and caring for others.   ACTIVITY LIMITATIONS: carrying, lifting, bending, sitting, standing, squatting, sleeping, transfers, bed mobility, bathing, toileting, and locomotion level  PARTICIPATION LIMITATIONS: meal prep, cleaning, laundry, shopping, community activity, and yard work  PERSONAL FACTORS: Age, Fitness, Past/current experiences, Time since onset of injury/illness/exacerbation, and 3+ comorbidities: HLD, HTN, athritis, prostate cancer  are also affecting patient's functional outcome.   REHAB POTENTIAL: Good  CLINICAL DECISION MAKING: Evolving/moderate complexity  EVALUATION COMPLEXITY: Moderate   GOALS: Goals reviewed with patient? Yes  SHORT TERM GOALS: Target date: 10/08/22 Pt will be independent with HEP in order to improve strength and balance in order to decrease fall risk and improve function at home and work.  Baseline: HEP given Goal status: INITIAL   LONG TERM GOALS: Target date: 11/17/22  Pt will demonstrate L knee mobility of 0-120d in order to demonstrate mobility needed for normalized gait Baseline: 22-75 Goal status: INITIAL  2.  Patient will increase FOTO score to 61 to demonstrate predicted increase in functional mobility to complete ADLs  Baseline: 36 Goal status: INITIAL  3.  Pt will demonstrate 5TSTS by at least 13 seconds in order to demonstrate age matched LE strength needed for ind transfers  Baseline: 40sec Goal status: INITIAL  4.  Pt will increase 10MWT to at least 1.65m/s in order to demonstrate community ambulation speed with decreased fall risk   Baseline: self selected: 0.40m/s fastest: 0.38m/s Goal  status: INITIAL     PLAN:  PT FREQUENCY: 1-2x/week  PT DURATION: 8 weeks  PLANNED INTERVENTIONS: Therapeutic exercises, Therapeutic activity, Neuromuscular re-education, Balance training, Gait training, Patient/Family education, Self Care, Joint mobilization, Joint manipulation, Stair training, Dry Needling, and Electrical stimulation  PLAN FOR NEXT SESSION: SPC progression and L knee AROM/strengthening   Salem Caster.  Fairly IV, PT, DPT Physical Therapist- [facility]  09/29/2022, 9:38 AM

## 2022-10-03 ENCOUNTER — Ambulatory Visit: Payer: Medicare PPO | Admitting: Physical Therapy

## 2022-10-03 ENCOUNTER — Encounter: Payer: Self-pay | Admitting: Physical Therapy

## 2022-10-03 DIAGNOSIS — M25562 Pain in left knee: Secondary | ICD-10-CM | POA: Diagnosis not present

## 2022-10-03 DIAGNOSIS — M1712 Unilateral primary osteoarthritis, left knee: Secondary | ICD-10-CM | POA: Diagnosis not present

## 2022-10-03 NOTE — Therapy (Signed)
OUTPATIENT PHYSICAL THERAPY LOWER EXTREMITY TREATMENT   Patient Name: Robert [name] MRN: [id number] DOB:14-Jun-1941, 82 y.o., male Today's Date: 10/03/2022  END OF SESSION:  PT End of Session - 10/03/22 0839     Visit Number 7    Number of Visits 17    Date for PT Re-Evaluation 11/24/22    Authorization - Visit Number 7    Authorization - Number of Visits 10    Progress Note Due on Visit 10    PT Start Time 0835    PT Stop Time 0915    PT Time Calculation (min) 40 min    Equipment Utilized During Treatment Gait belt    Activity Tolerance Patient tolerated treatment well    Behavior During Therapy WFL for tasks assessed/performed                  Past Medical History:  Diagnosis Date   Arthritis    Diverticulosis, sigmoid 08/12/2014   Moderate, noted on colonoscopy   History of adenomatous polyp of colon    Hyperlipidemia    Hypertension    followed by pcp   (03-16-2020  per pt had stress test several years ago, told ok)   Left inguinal hernia    Prostate cancer [facility]) urologist-- dr Junious Silk   dx 2019,  Stage T1c, Gleason 3+4;  s/p  brachytherapy 03-08-2018   Wears glasses    Past Surgical History:  Procedure Laterality Date   COLONOSCOPY W/ POLYPECTOMY  last one 12-18-2019  dr Henrene Pastor   CYSTOSCOPY N/A 03/08/2018   Procedure: CYSTOSCOPY;  Surgeon: Festus Aloe, MD;  Location: [facility];  Service: Urology;  Laterality: N/A;  no seeds in bladder per dr eskridge   INGUINAL HERNIA REPAIR Left 03/19/2020   Procedure: LEFT INGUINAL HERNIA REPAIR;  Surgeon: Kieth Brightly, Arta Bruce, MD;  Location: [facility];  Service: General;  Laterality: Left;   INSERTION OF MESH Left 03/19/2020   Procedure: INSERTION OF MESH;  Surgeon: Kinsinger, Arta Bruce, MD;  Location: [facility];  Service: General;  Laterality: Left;   PROSTATE BIOPSY  10/16/2017   RADIOACTIVE SEED IMPLANT N/A 03/08/2018   Procedure: RADIOACTIVE SEED  IMPLANT/BRACHYTHERAPY IMPLANT;  Surgeon: Festus Aloe, MD;  Location: [place];  Service: Urology;  Laterality: N/A;  86 seeds   SPACE OAR INSTILLATION N/A 03/08/2018   Procedure: SPACE OAR INSTILLATION;  Surgeon: Festus Aloe, MD;  Location: [facility];  Service: Urology;  Laterality: N/A;   TONSILLECTOMY  child   TOTAL KNEE ARTHROPLASTY Left 08/25/2022   Procedure: LEFT TOTAL KNEE ARTHROPLASTY;  Surgeon: Mcarthur Rossetti, MD;  Location: [facility];  Service: Orthopedics;  Laterality: Left;   Patient Active Problem List   Diagnosis Date Noted   Status post total left knee replacement 08/25/2022   Unilateral primary osteoarthritis, left knee 06/14/2022   Malignant neoplasm of prostate ([place]) 12/10/2017    PCP: Philip Aspen MD  REFERRING PROVIDER: Philis Fendt MD  REFERRING DIAG: L TKA  THERAPY DIAG:  Acute pain of left knee  Rationale for Evaluation and Treatment: Rehabilitation  ONSET DATE: 08/25/22  SUBJECTIVE:   SUBJECTIVE STATEMENT: Pt reports 3/10 pain, more soreness> pain this morning. Patient reports he doesn't feel like he needs his walker as much as home, that he is putting less weight on his hands.   PERTINENT HISTORY: Pt is an 82 year old male presenting s/p L TKA 08/25/22. PLOF ambulation without AD, complete ind with all ADLs. Currently  ambulating with RW in the home and community. Lives at home with wife, with family in the area. His daughter is staying with him to make meals, clean, drive, etc. Reports he is dressing himself though he does need assistance from daughter to don/doff ted hose. Home with ramped entry, single level, walk in shower with seat and grab bars, and standard height toilet seat. Current pain level 5/10 best: /10 worst 8/10. Pt has had HHPT since surgery, and is doing mini squats, and walking down the hall at home. Is taking oxycodone as needed for pain. No falls in past 6 months. Pt denies N/V, B&B changes,  unexplained weight fluctuation, saddle paresthesia, fever, night sweats, or unrelenting night pain at this time.   PAIN:  Are you having pain? Yes: NPRS scale: 5/10 Pain location: L knee Pain description: aching Aggravating factors: standing, walking Relieving factors: rest  PRECAUTIONS: None  WEIGHT BEARING RESTRICTIONS: No  FALLS:  Has patient fallen in last 6 months? No  LIVING ENVIRONMENT: Lives with: lives with their family Lives in: House/apartment Stairs: No Has following equipment at home: Single point cane, Environmental consultant - 2 wheeled, Electronics engineer, Grab bars, and Ramped entry  OCCUPATION: Retired  PLOF: Independent with basic ADLs  PATIENT GOALS: Be able to walk without RW  NEXT MD VISIT: 09/27/22  OBJECTIVE:   DIAGNOSTIC FINDINGS:   PATIENT SURVEYS:  FOTO 36 goal 38  COGNITION: Overall cognitive status: Within functional limits for tasks assessed     SENSATION: WFL  EDEMA:  Circumferential: L mid patella 46cm; R mid patell 41cm  MUSCLE LENGTH: Hamstrings: shortened bilat Thomas test: shortened RLE; unable to test on L  POSTURE: rounded shoulders, forward head, flexed trunk , and weight shift right  PALPATION: Swelling inhibiting patellar movement; ttp diffuse of L knee  LOWER EXTREMITY ROM:  Active ROM Right eval Left eval  Hip flexion 110/110 90/110  Hip extension ~5/10 bilat  Hip abduction    Hip adduction    Hip internal rotation 50% limited bilat  Hip external rotation    Knee flexion 128 75/78  Knee extension 0 25/22  Ankle dorsiflexion 15 14  Ankle plantarflexion    Ankle inversion    Ankle eversion     (Blank rows = not tested)  LOWER EXTREMITY MMT:  MMT Right eval Left eval  Hip flexion 4+ 2+  Hip extension 3- 2+  Hip abduction 4- 3+  Hip adduction    Hip internal rotation 5 4+  Hip external rotation 4+ 4  Knee flexion 4+ 3+  Knee extension 4+ 3  Ankle dorsiflexion 4+ 4+  Ankle plantarflexion    Ankle inversion     Ankle eversion     (Blank rows = not tested)   FUNCTIONAL TESTS:  5 times sit to stand: 40sec with UE use for push off 10 meter walk test: self selected: 0.65m/s fastest: 0.46m/s SLS unable bilat  GAIT: Distance walked: 68M Assistive device utilized: Environmental consultant - 2 wheeled Level of assistance: Modified independence Comments: decreased L stance time; decreased RLE step length, heavy reliance on RW with RLE swing    TODAY'S TREATMENT:  DATE: 09/21/22  Recumbent bike AAROM knee flex/ext seat 12>11, 5 mins  Heel prop stretch with bolster roll under heel 5# AW at distal femur 2x60seconds. Grade 3 AP femur mob 30sec x2 between stretches AROM following 21-90 Prone hanging off edge of table with 5# AW at distal lower leg x60sec  SLR 2x 10 with min quad lag maintained throughout  STS with RLE on foam no Ues x10; mini squat with RLE on foam x8 with cuing for technique and TKE with difficulty   OMEGA leg press 15# 2x 8 with cuing for full knee ext with good carry over  Ambulating without AD in x100'; SPC 133ft consistent cuing for heel strike with knee ext with decent carry over   PATIENT EDUCATION:  Education details: Patient was educated on diagnosis, anatomy and pathology involved, prognosis, role of PT, and was given an HEP, demonstrating exercise with proper form following verbal and tactile cues, and was given a paper hand out to continue exercise at home. Pt was educated on and agreed to plan of care.  Person educated: Patient Education method: Explanation, Demonstration, and Verbal cues Education comprehension: verbalized understanding, returned demonstration, and verbal cues required  HOME EXERCISE PROGRAM: [id number]  ASSESSMENT:  CLINICAL IMPRESSION: Continuing PT POC with focus on improving L knee AROM and LE strengthening. Pt is able to comply with  all cuing for proper technique of therex with good motivation and no increased pain throughout session. Patient with most difficulty with knee ext and quad activation, encouraged to continue prop stretch as often as possible at home.  Pt will continue to benefit from skilled PT services to address remaining deficits in L knee to reduce falls risk and return to PLOF.     OBJECTIVE IMPAIRMENTS: carrying, lifting, bending, standing, squatting, stairs, transfers, bed mobility, bathing, dressing, hygiene/grooming, and caring for others.   ACTIVITY LIMITATIONS: carrying, lifting, bending, sitting, standing, squatting, sleeping, transfers, bed mobility, bathing, toileting, and locomotion level  PARTICIPATION LIMITATIONS: meal prep, cleaning, laundry, shopping, community activity, and yard work  PERSONAL FACTORS: Age, Fitness, Past/current experiences, Time since onset of injury/illness/exacerbation, and 3+ comorbidities: HLD, HTN, athritis, prostate cancer  are also affecting patient's functional outcome.   REHAB POTENTIAL: Good  CLINICAL DECISION MAKING: Evolving/moderate complexity  EVALUATION COMPLEXITY: Moderate   GOALS: Goals reviewed with patient? Yes  SHORT TERM GOALS: Target date: 10/08/22 Pt will be independent with HEP in order to improve strength and balance in order to decrease fall risk and improve function at home and work.  Baseline: HEP given Goal status: INITIAL   LONG TERM GOALS: Target date: 11/17/22  Pt will demonstrate L knee mobility of 0-120d in order to demonstrate mobility needed for normalized gait Baseline: 22-75 Goal status: INITIAL  2.  Patient will increase FOTO score to 61 to demonstrate predicted increase in functional mobility to complete ADLs  Baseline: 36 Goal status: INITIAL  3.  Pt will demonstrate 5TSTS by at least 13 seconds in order to demonstrate age matched LE strength needed for ind transfers  Baseline: 40sec Goal status: INITIAL  4.  Pt  will increase 10MWT to at least 1.58m/s in order to demonstrate community ambulation speed with decreased fall risk   Baseline: self selected: 0.1m/s fastest: 0.2m/s Goal status: INITIAL     PLAN:  PT FREQUENCY: 1-2x/week  PT DURATION: 8 weeks  PLANNED INTERVENTIONS: Therapeutic exercises, Therapeutic activity, Neuromuscular re-education, Balance training, Gait training, Patient/Family education, Self Care, Joint mobilization, Joint manipulation, Stair training, Dry Needling, and  Electrical stimulation  PLAN FOR NEXT SESSION: SPC progression and L knee AROM/strengthening   Durwin Reges 10/03/2022, 9:14 AM

## 2022-10-06 ENCOUNTER — Encounter: Payer: Self-pay | Admitting: Physical Therapy

## 2022-10-06 ENCOUNTER — Ambulatory Visit: Payer: Medicare PPO | Admitting: Physical Therapy

## 2022-10-06 DIAGNOSIS — M25562 Pain in left knee: Secondary | ICD-10-CM

## 2022-10-06 DIAGNOSIS — M1712 Unilateral primary osteoarthritis, left knee: Secondary | ICD-10-CM | POA: Diagnosis not present

## 2022-10-06 NOTE — Therapy (Signed)
OUTPATIENT PHYSICAL THERAPY LOWER EXTREMITY TREATMENT   Patient Name: Robert [name] MRN: [id number] DOB:01/24/1941, 82 y.o., male Today's Date: 10/06/2022  END OF SESSION:  PT End of Session - 10/06/22 0917     Visit Number 8    Number of Visits 17    Date for PT Re-Evaluation 11/24/22    Authorization - Visit Number 8    Authorization - Number of Visits 10    Progress Note Due on Visit 10    PT Start Time 0915    PT Stop Time [id number]    PT Time Calculation (min) 40 min    Equipment Utilized During Treatment Gait belt    Activity Tolerance Patient tolerated treatment well    Behavior During Therapy WFL for tasks assessed/performed                   Past Medical History:  Diagnosis Date   Arthritis    Diverticulosis, sigmoid 08/12/2014   Moderate, noted on colonoscopy   History of adenomatous polyp of colon    Hyperlipidemia    Hypertension    followed by pcp   (03-16-2020  per pt had stress test several years ago, told ok)   Left inguinal hernia    Prostate cancer [facility]) urologist-- dr Junious Silk   dx 2019,  Stage T1c, Gleason 3+4;  s/p  brachytherapy 03-08-2018   Wears glasses    Past Surgical History:  Procedure Laterality Date   COLONOSCOPY W/ POLYPECTOMY  last one 12-18-2019  dr Henrene Pastor   CYSTOSCOPY N/A 03/08/2018   Procedure: CYSTOSCOPY;  Surgeon: Festus Aloe, MD;  Location: [facility];  Service: Urology;  Laterality: N/A;  no seeds in bladder per dr eskridge   INGUINAL HERNIA REPAIR Left 03/19/2020   Procedure: LEFT INGUINAL HERNIA REPAIR;  Surgeon: Kieth Brightly, Arta Bruce, MD;  Location: [facility];  Service: General;  Laterality: Left;   INSERTION OF MESH Left 03/19/2020   Procedure: INSERTION OF MESH;  Surgeon: Kinsinger, Arta Bruce, MD;  Location: [facility];  Service: General;  Laterality: Left;   PROSTATE BIOPSY  10/16/2017   RADIOACTIVE SEED IMPLANT N/A 03/08/2018   Procedure: RADIOACTIVE SEED  IMPLANT/BRACHYTHERAPY IMPLANT;  Surgeon: Festus Aloe, MD;  Location: [place];  Service: Urology;  Laterality: N/A;  86 seeds   SPACE OAR INSTILLATION N/A 03/08/2018   Procedure: SPACE OAR INSTILLATION;  Surgeon: Festus Aloe, MD;  Location: [facility];  Service: Urology;  Laterality: N/A;   TONSILLECTOMY  child   TOTAL KNEE ARTHROPLASTY Left 08/25/2022   Procedure: LEFT TOTAL KNEE ARTHROPLASTY;  Surgeon: Mcarthur Rossetti, MD;  Location: [facility];  Service: Orthopedics;  Laterality: Left;   Patient Active Problem List   Diagnosis Date Noted   Status post total left knee replacement 08/25/2022   Unilateral primary osteoarthritis, left knee 06/14/2022   Malignant neoplasm of prostate ([place]) 12/10/2017    PCP: Philip Aspen MD  REFERRING PROVIDER: Philis Fendt MD  REFERRING DIAG: L TKA  THERAPY DIAG:  Acute pain of left knee  Rationale for Evaluation and Treatment: Rehabilitation  ONSET DATE: 08/25/22  SUBJECTIVE:   SUBJECTIVE STATEMENT: Pt reports having more today 6/10, due to "moving around more". Pt requestint to not use recumbent bike today  PERTINENT HISTORY: Pt is an 82 year old male presenting s/p L TKA 08/25/22. PLOF ambulation without AD, complete ind with all ADLs. Currently ambulating with RW in the home and community. Lives at home with wife,  with family in the area. His daughter is staying with him to make meals, clean, drive, etc. Reports he is dressing himself though he does need assistance from daughter to don/doff ted hose. Home with ramped entry, single level, walk in shower with seat and grab bars, and standard height toilet seat. Current pain level 5/10 best: /10 worst 8/10. Pt has had HHPT since surgery, and is doing mini squats, and walking down the hall at home. Is taking oxycodone as needed for pain. No falls in past 6 months. Pt denies N/V, B&B changes, unexplained weight fluctuation, saddle paresthesia, fever, night sweats,  or unrelenting night pain at this time.   PAIN:  Are you having pain? Yes: NPRS scale: 5/10 Pain location: L knee Pain description: aching Aggravating factors: standing, walking Relieving factors: rest  PRECAUTIONS: None  WEIGHT BEARING RESTRICTIONS: No  FALLS:  Has patient fallen in last 6 months? No  LIVING ENVIRONMENT: Lives with: lives with their family Lives in: House/apartment Stairs: No Has following equipment at home: Single point cane, Environmental consultant - 2 wheeled, Electronics engineer, Grab bars, and Ramped entry  OCCUPATION: Retired  PLOF: Independent with basic ADLs  PATIENT GOALS: Be able to walk without RW  NEXT MD VISIT: 09/27/22  OBJECTIVE:   DIAGNOSTIC FINDINGS:   PATIENT SURVEYS:  FOTO 36 goal 52  COGNITION: Overall cognitive status: Within functional limits for tasks assessed     SENSATION: WFL  EDEMA:  Circumferential: L mid patella 46cm; R mid patell 41cm  MUSCLE LENGTH: Hamstrings: shortened bilat Thomas test: shortened RLE; unable to test on L  POSTURE: rounded shoulders, forward head, flexed trunk , and weight shift right  PALPATION: Swelling inhibiting patellar movement; ttp diffuse of L knee  LOWER EXTREMITY ROM:  Active ROM Right eval Left eval  Hip flexion 110/110 90/110  Hip extension ~5/10 bilat  Hip abduction    Hip adduction    Hip internal rotation 50% limited bilat  Hip external rotation    Knee flexion 128 75/78  Knee extension 0 25/22  Ankle dorsiflexion 15 14  Ankle plantarflexion    Ankle inversion    Ankle eversion     (Blank rows = not tested)  LOWER EXTREMITY MMT:  MMT Right eval Left eval  Hip flexion 4+ 2+  Hip extension 3- 2+  Hip abduction 4- 3+  Hip adduction    Hip internal rotation 5 4+  Hip external rotation 4+ 4  Knee flexion 4+ 3+  Knee extension 4+ 3  Ankle dorsiflexion 4+ 4+  Ankle plantarflexion    Ankle inversion    Ankle eversion     (Blank rows = not tested)   FUNCTIONAL TESTS:  5  times sit to stand: 40sec with UE use for push off 10 meter walk test: self selected: 0.41m/s fastest: 0.79m/s SLS unable bilat  GAIT: Distance walked: 24M Assistive device utilized: Environmental consultant - 2 wheeled Level of assistance: Modified independence Comments: decreased L stance time; decreased RLE step length, heavy reliance on RW with RLE swing    TODAY'S TREATMENT:  DATE: 09/21/22  Nustep Seat 11 LE only L3 5 mins; cuing for SPM in 60s  Heel prop stretch with bolster roll under heel 5# AW at distal femur 2x60seconds.  Prone hanging off edge of table with 5# AW at distal lower leg 2x 30sec with 82min of prone laying with adjustment to roll onto L hip between bouts  SLR 2x 10 with min quad lag maintained throughout  STS with RLE on 3in step no Ues 2 x10; cuing to allow for L knee flexion for lower with good carry over  Forward step down from 6in step with unilateral handrail support 2x 10 ; max cuing for eccentric lower and maintaining L heel contact to step with decent carry over  Ambulating SPC 15ft consistent cuing for heel strike without post lean with decent carry over   PATIENT EDUCATION:  Education details: Patient was educated on diagnosis, anatomy and pathology involved, prognosis, role of PT, and was given an HEP, demonstrating exercise with proper form following verbal and tactile cues, and was given a paper hand out to continue exercise at home. Pt was educated on and agreed to plan of care.  Person educated: Patient Education method: Explanation, Demonstration, and Verbal cues Education comprehension: verbalized understanding, returned demonstration, and verbal cues required  HOME EXERCISE PROGRAM: [id number]  ASSESSMENT:  CLINICAL IMPRESSION: Continuing PT POC with focus on improving L knee AROM and LE strengthening. Pt is able to comply with all  cuing for proper technique of therex with good motivation and no increased pain throughout session. PT continuing to encourage compliance of HEP with patient reporting understanding, that it is "hard to do at home". PT updated patient on progress being behind schedule d/t non-compliance with understanding  Pt will continue to benefit from skilled PT services to address remaining deficits in L knee to reduce falls risk and return to PLOF.     OBJECTIVE IMPAIRMENTS: carrying, lifting, bending, standing, squatting, stairs, transfers, bed mobility, bathing, dressing, hygiene/grooming, and caring for others.   ACTIVITY LIMITATIONS: carrying, lifting, bending, sitting, standing, squatting, sleeping, transfers, bed mobility, bathing, toileting, and locomotion level  PARTICIPATION LIMITATIONS: meal prep, cleaning, laundry, shopping, community activity, and yard work  PERSONAL FACTORS: Age, Fitness, Past/current experiences, Time since onset of injury/illness/exacerbation, and 3+ comorbidities: HLD, HTN, athritis, prostate cancer  are also affecting patient's functional outcome.   REHAB POTENTIAL: Good  CLINICAL DECISION MAKING: Evolving/moderate complexity  EVALUATION COMPLEXITY: Moderate   GOALS: Goals reviewed with patient? Yes  SHORT TERM GOALS: Target date: 10/08/22 Pt will be independent with HEP in order to improve strength and balance in order to decrease fall risk and improve function at home and work.  Baseline: HEP given Goal status: INITIAL   LONG TERM GOALS: Target date: 11/17/22  Pt will demonstrate L knee mobility of 0-120d in order to demonstrate mobility needed for normalized gait Baseline: 22-75 Goal status: INITIAL  2.  Patient will increase FOTO score to 61 to demonstrate predicted increase in functional mobility to complete ADLs  Baseline: 36 Goal status: INITIAL  3.  Pt will demonstrate 5TSTS by at least 13 seconds in order to demonstrate age matched LE strength  needed for ind transfers  Baseline: 40sec Goal status: INITIAL  4.  Pt will increase 10MWT to at least 1.85m/s in order to demonstrate community ambulation speed with decreased fall risk   Baseline: self selected: 0.55m/s fastest: 0.36m/s Goal status: INITIAL     PLAN:  PT FREQUENCY: 1-2x/week  PT DURATION: 8 weeks  PLANNED INTERVENTIONS: Therapeutic exercises, Therapeutic activity, Neuromuscular re-education, Balance training, Gait training, Patient/Family education, Self Care, Joint mobilization, Joint manipulation, Stair training, Dry Needling, and Electrical stimulation  PLAN FOR NEXT SESSION: SPC progression and L knee AROM/strengthening   Durwin Reges 10/06/2022, 9:57 AM

## 2022-10-09 ENCOUNTER — Encounter: Payer: Self-pay | Admitting: Physical Therapy

## 2022-10-09 ENCOUNTER — Ambulatory Visit: Payer: Medicare PPO | Attending: Orthopaedic Surgery | Admitting: Physical Therapy

## 2022-10-09 ENCOUNTER — Encounter: Payer: Self-pay | Admitting: Orthopaedic Surgery

## 2022-10-09 ENCOUNTER — Ambulatory Visit (INDEPENDENT_AMBULATORY_CARE_PROVIDER_SITE_OTHER): Payer: Medicare PPO | Admitting: Orthopaedic Surgery

## 2022-10-09 DIAGNOSIS — M25562 Pain in left knee: Secondary | ICD-10-CM | POA: Diagnosis not present

## 2022-10-09 DIAGNOSIS — Z96652 Presence of left artificial knee joint: Secondary | ICD-10-CM

## 2022-10-09 NOTE — Progress Notes (Signed)
The patient is an 82 year old who is now 6 weeks status post a left total knee arthroplasty.  His daughter is with him today.  He is ambulating with a walker.  He has been going to physical therapy.  On my exam today he lacks full extension by about 5 degrees and I pushed him hard but can flex him to maybe just 90 degrees.  I did recommend placing a steroid injection in his knee but I am concerned about the need for manipulation under anesthesia.  I would like to see him back in just 2 weeks to see how he is doing from a range of motion standpoint and consider proceeding with a manipulation at that point if he is not improving his range of motion.  He and his daughter understand this as well.

## 2022-10-09 NOTE — Therapy (Signed)
OUTPATIENT PHYSICAL THERAPY LOWER EXTREMITY TREATMENT   Patient Name: TEKOA KOVAC MRN: [id number] DOB:[birth year], 82 y.o., male Today's Date: 10/09/2022  END OF SESSION:  PT End of Session - 10/09/22 1121     Visit Number 9    Number of Visits 17    Date for PT Re-Evaluation 11/24/22    Authorization - Visit Number 9    Authorization - Number of Visits 10    Progress Note Due on Visit 10    PT Start Time 1117    PT Stop Time 1156    PT Time Calculation (min) 39 min    Equipment Utilized During Treatment Gait belt    Activity Tolerance Patient tolerated treatment well    Behavior During Therapy WFL for tasks assessed/performed                    Past Medical History:  Diagnosis Date   Arthritis    Diverticulosis, sigmoid 08/12/2014   Moderate, noted on colonoscopy   History of adenomatous polyp of colon    Hyperlipidemia    Hypertension    followed by pcp   (03-16-2020  per pt had stress test several years ago, told ok)   Left inguinal hernia    Prostate cancer urologist-- dr Junious Silk   dx 2019,  Stage T1c, Gleason 3+4;  s/p  brachytherapy 03-08-2018   Wears glasses    Past Surgical History:  Procedure Laterality Date   COLONOSCOPY W/ POLYPECTOMY  last one 12-18-2019  dr Henrene Pastor   CYSTOSCOPY N/A 03/08/2018   Procedure: CYSTOSCOPY;  Surgeon: Festus Aloe, MD;  Location: [facility];  Service: Urology;  Laterality: N/A;  no seeds in bladder per dr eskridge   INGUINAL HERNIA REPAIR Left 03/19/2020   Procedure: LEFT INGUINAL HERNIA REPAIR;  Surgeon: Kieth Brightly, Arta Bruce, MD;  Location: [facility];  Service: General;  Laterality: Left;   INSERTION OF MESH Left 03/19/2020   Procedure: INSERTION OF MESH;  Surgeon: Kinsinger, Arta Bruce, MD;  Location: [facility];  Service: General;  Laterality: Left;   PROSTATE BIOPSY  10/16/2017   RADIOACTIVE SEED IMPLANT N/A 03/08/2018   Procedure: RADIOACTIVE SEED  IMPLANT/BRACHYTHERAPY IMPLANT;  Surgeon: Festus Aloe, MD;  Location: [place];  Service: Urology;  Laterality: N/A;  86 seeds   SPACE OAR INSTILLATION N/A 03/08/2018   Procedure: SPACE OAR INSTILLATION;  Surgeon: Festus Aloe, MD;  Location: [facility];  Service: Urology;  Laterality: N/A;   TONSILLECTOMY  child   TOTAL KNEE ARTHROPLASTY Left 08/25/2022   Procedure: LEFT TOTAL KNEE ARTHROPLASTY;  Surgeon: Mcarthur Rossetti, MD;  Location: [facility];  Service: Orthopedics;  Laterality: Left;   Patient Active Problem List   Diagnosis Date Noted   Status post total left knee replacement 08/25/2022   Unilateral primary osteoarthritis, left knee 06/14/2022   Malignant neoplasm of prostate 12/10/2017    PCP: Philip Aspen MD  REFERRING PROVIDER: Philis Fendt MD  REFERRING DIAG: L TKA  THERAPY DIAG:  Acute pain of left knee  Rationale for Evaluation and Treatment: Rehabilitation  ONSET DATE: 08/25/22  SUBJECTIVE:   SUBJECTIVE STATEMENT: Pt reports having less pain today after having cortisone shot this morning. Is currently 4/10. Ambulates into clinic with [facility] that he has been using today. Surgeon suggested patient have manipulation under anesthesia should knee flex not improve in 2 weeks.    PERTINENT HISTORY: Pt is an 82 year old male presenting s/p L TKA 08/25/22.  PLOF ambulation without AD, complete ind with all ADLs. Currently ambulating with RW in the home and community. Lives at home with wife, with family in the area. His daughter is staying with him to make meals, clean, drive, etc. Reports he is dressing himself though he does need assistance from daughter to don/doff ted hose. Home with ramped entry, single level, walk in shower with seat and grab bars, and standard height toilet seat. Current pain level 5/10 best: /10 worst 8/10. Pt has had HHPT since surgery, and is doing mini squats, and walking down the hall at home. Is taking oxycodone as  needed for pain. No falls in past 6 months. Pt denies N/V, B&B changes, unexplained weight fluctuation, saddle paresthesia, fever, night sweats, or unrelenting night pain at this time.   PAIN:  Are you having pain? Yes: NPRS scale: 5/10 Pain location: L knee Pain description: aching Aggravating factors: standing, walking Relieving factors: rest  PRECAUTIONS: None  WEIGHT BEARING RESTRICTIONS: No  FALLS:  Has patient fallen in last 6 months? No  LIVING ENVIRONMENT: Lives with: lives with their family Lives in: House/apartment Stairs: No Has following equipment at home: Single point cane, Environmental consultant - 2 wheeled, Electronics engineer, Grab bars, and Ramped entry  OCCUPATION: Retired  PLOF: Independent with basic ADLs  PATIENT GOALS: Be able to walk without RW  NEXT MD VISIT: 09/27/22  OBJECTIVE:   DIAGNOSTIC FINDINGS:   PATIENT SURVEYS:  FOTO 36 goal 67  COGNITION: Overall cognitive status: Within functional limits for tasks assessed     SENSATION: WFL  EDEMA:  Circumferential: L mid patella 46cm; R mid patell 41cm  MUSCLE LENGTH: Hamstrings: shortened bilat Thomas test: shortened RLE; unable to test on L  POSTURE: rounded shoulders, forward head, flexed trunk , and weight shift right  PALPATION: Swelling inhibiting patellar movement; ttp diffuse of L knee  LOWER EXTREMITY ROM:  Active ROM Right eval Left eval  Hip flexion 110/110 90/110  Hip extension ~5/10 bilat  Hip abduction    Hip adduction    Hip internal rotation 50% limited bilat  Hip external rotation    Knee flexion 128 75/78  Knee extension 0 25/22  Ankle dorsiflexion 15 14  Ankle plantarflexion    Ankle inversion    Ankle eversion     (Blank rows = not tested)  LOWER EXTREMITY MMT:  MMT Right eval Left eval  Hip flexion 4+ 2+  Hip extension 3- 2+  Hip abduction 4- 3+  Hip adduction    Hip internal rotation 5 4+  Hip external rotation 4+ 4  Knee flexion 4+ 3+  Knee extension 4+ 3   Ankle dorsiflexion 4+ 4+  Ankle plantarflexion    Ankle inversion    Ankle eversion     (Blank rows = not tested)   FUNCTIONAL TESTS:  5 times sit to stand: 40sec with UE use for push off 10 meter walk test: self selected: 0.97m/s fastest: 0.63m/s SLS unable bilat  GAIT: Distance walked: 64M Assistive device utilized: Environmental consultant - 2 wheeled Level of assistance: Modified independence Comments: decreased L stance time; decreased RLE step length, heavy reliance on RW with RLE swing    TODAY'S TREATMENT:  DATE: 09/21/22  Recumbent bike AAROM knee flex/ext seat 11, 5 mins   Supine heel slide x12 Heel prop stretch with bolster roll under heel 5# AW at distal femur x60seconds.  Femur G3-4 AP mob in above position 2x 30sec bouts Prone hanging off edge of table with 5# AW at distal lower leg 60sec  Sitting EOB traction + passive flexion x12  PROM following 15-110d AROM following 20-95d  Amb with SPC 156ft with cuing for normalized gait with decent carry over  STS with RLE on 3in step no Ues 2 x10; cuing to normalized knee mechanics through transfer with good carry over  OMEGA leg press # 2x 10    PATIENT EDUCATION:  Education details: Patient was educated on diagnosis, anatomy and pathology involved, prognosis, role of PT, and was given an HEP, demonstrating exercise with proper form following verbal and tactile cues, and was given a paper hand out to continue exercise at home. Pt was educated on and agreed to plan of care.  Person educated: Patient Education method: Explanation, Demonstration, and Verbal cues Education comprehension: verbalized understanding, returned demonstration, and verbal cues required  HOME EXERCISE PROGRAM: [id number]  ASSESSMENT:  CLINICAL IMPRESSION: Continuing PT POC with focus on improving L knee AROM and LE strengthening. Pt  is able to comply with all cuing for proper technique of therex with good motivation and no increased pain throughout session. Pt with increased passive mobility following traction with flex. PT continued education for normalized gait mechanics with decent carry over. Pt will continue to benefit from skilled PT services to address remaining deficits in L knee to reduce falls risk and return to PLOF.     OBJECTIVE IMPAIRMENTS: carrying, lifting, bending, standing, squatting, stairs, transfers, bed mobility, bathing, dressing, hygiene/grooming, and caring for others.   ACTIVITY LIMITATIONS: carrying, lifting, bending, sitting, standing, squatting, sleeping, transfers, bed mobility, bathing, toileting, and locomotion level  PARTICIPATION LIMITATIONS: meal prep, cleaning, laundry, shopping, community activity, and yard work  PERSONAL FACTORS: Age, Fitness, Past/current experiences, Time since onset of injury/illness/exacerbation, and 3+ comorbidities: HLD, HTN, athritis, prostate cancer  are also affecting patient's functional outcome.   REHAB POTENTIAL: Good  CLINICAL DECISION MAKING: Evolving/moderate complexity  EVALUATION COMPLEXITY: Moderate   GOALS: Goals reviewed with patient? Yes  SHORT TERM GOALS: Target date: 10/08/22 Pt will be independent with HEP in order to improve strength and balance in order to decrease fall risk and improve function at home and work.  Baseline: HEP given Goal status: INITIAL   LONG TERM GOALS: Target date: 11/17/22  Pt will demonstrate L knee mobility of 0-120d in order to demonstrate mobility needed for normalized gait Baseline: 22-75 Goal status: INITIAL  2.  Patient will increase FOTO score to 61 to demonstrate predicted increase in functional mobility to complete ADLs  Baseline: 36 Goal status: INITIAL  3.  Pt will demonstrate 5TSTS by at least 13 seconds in order to demonstrate age matched LE strength needed for ind transfers  Baseline:  40sec Goal status: INITIAL  4.  Pt will increase 10MWT to at least 1.55m/s in order to demonstrate community ambulation speed with decreased fall risk   Baseline: self selected: 0.59m/s fastest: 0.67m/s Goal status: INITIAL     PLAN:  PT FREQUENCY: 1-2x/week  PT DURATION: 8 weeks  PLANNED INTERVENTIONS: Therapeutic exercises, Therapeutic activity, Neuromuscular re-education, Balance training, Gait training, Patient/Family education, Self Care, Joint mobilization, Joint manipulation, Stair training, Dry Needling, and Electrical stimulation  PLAN FOR NEXT SESSION: SPC progression and L  knee AROM/strengthening   Durwin Reges 10/09/2022, 12:00 PM

## 2022-10-11 ENCOUNTER — Encounter: Payer: Self-pay | Admitting: Physical Therapy

## 2022-10-11 ENCOUNTER — Ambulatory Visit: Payer: Medicare PPO | Admitting: Physical Therapy

## 2022-10-11 DIAGNOSIS — M25562 Pain in left knee: Secondary | ICD-10-CM

## 2022-10-11 NOTE — Therapy (Signed)
OUTPATIENT PHYSICAL THERAPY LOWER EXTREMITY TREATMENT/Progress Report   Patient Name: EZRIEL HARBEN MRN: [id number] DOB:Jul 30, 1940, 82 y.o., male Today's Date: 10/11/2022  END OF SESSION:  PT End of Session - 10/11/22 1052     Visit Number 10    Number of Visits 17    Date for PT Re-Evaluation 11/24/22    Authorization Type [employer]    Authorization Time Period 20 visits 09/15/22 - 11/24/22    Authorization - Visit Number 10    Authorization - Number of Visits 20    Progress Note Due on Visit 10    PT Start Time [id number]    PT Stop Time 1127    PT Time Calculation (min) 38 min    Activity Tolerance Patient tolerated treatment well    Behavior During Therapy [facility] for tasks assessed/performed                     Past Medical History:  Diagnosis Date   Arthritis    Diverticulosis, sigmoid 08/12/2014   Moderate, noted on colonoscopy   History of adenomatous polyp of colon    Hyperlipidemia    Hypertension    followed by pcp   (03-16-2020  per pt had stress test several years ago, told ok)   Left inguinal hernia    Prostate cancer urologist-- dr Junious Silk   dx 2019,  Stage T1c, Gleason 3+4;  s/p  brachytherapy 03-08-2018   Wears glasses    Past Surgical History:  Procedure Laterality Date   COLONOSCOPY W/ POLYPECTOMY  last one 12-18-2019  dr Henrene Pastor   CYSTOSCOPY N/A 03/08/2018   Procedure: CYSTOSCOPY;  Surgeon: Festus Aloe, MD;  Location: [facility];  Service: Urology;  Laterality: N/A;  no seeds in bladder per dr eskridge   INGUINAL HERNIA REPAIR Left 03/19/2020   Procedure: LEFT INGUINAL HERNIA REPAIR;  Surgeon: Kieth Brightly, Arta Bruce, MD;  Location: [facility];  Service: General;  Laterality: Left;   INSERTION OF MESH Left 03/19/2020   Procedure: INSERTION OF MESH;  Surgeon: Kinsinger, Arta Bruce, MD;  Location: [facility];  Service: General;  Laterality: Left;   PROSTATE BIOPSY  10/16/2017   RADIOACTIVE SEED IMPLANT N/A  03/08/2018   Procedure: RADIOACTIVE SEED IMPLANT/BRACHYTHERAPY IMPLANT;  Surgeon: Festus Aloe, MD;  Location: [facility];  Service: Urology;  Laterality: N/A;  86 seeds   SPACE OAR INSTILLATION N/A 03/08/2018   Procedure: SPACE OAR INSTILLATION;  Surgeon: Festus Aloe, MD;  Location: [facility];  Service: Urology;  Laterality: N/A;   TONSILLECTOMY  child   TOTAL KNEE ARTHROPLASTY Left 08/25/2022   Procedure: LEFT TOTAL KNEE ARTHROPLASTY;  Surgeon: Mcarthur Rossetti, MD;  Location: [facility];  Service: Orthopedics;  Laterality: Left;   Patient Active Problem List   Diagnosis Date Noted   Status post total left knee replacement 08/25/2022   Unilateral primary osteoarthritis, left knee 06/14/2022   Malignant neoplasm of prostate 12/10/2017    PCP: Philip Aspen MD  REFERRING PROVIDER: Philis Fendt MD  REFERRING DIAG: L TKA  THERAPY DIAG:  Acute pain of left knee  Rationale for Evaluation and Treatment: Rehabilitation  ONSET DATE: 08/25/22  SUBJECTIVE:   SUBJECTIVE STATEMENT: Pt reports he is doing "more home exercises than normal". He is reporting some increased pain/swelling 5/10 pain today, due to moving more at home.Is using SPC at home, reports he agrees that he should have started with it several weeks ago.   PERTINENT HISTORY: Pt is  an 82 year old male presenting s/p L TKA 08/25/22. PLOF ambulation without AD, complete ind with all ADLs. Currently ambulating with RW in the home and community. Lives at home with wife, with family in the area. His daughter is staying with him to make meals, clean, drive, etc. Reports he is dressing himself though he does need assistance from daughter to don/doff ted hose. Home with ramped entry, single level, walk in shower with seat and grab bars, and standard height toilet seat. Current pain level 5/10 best: /10 worst 8/10. Pt has had HHPT since surgery, and is doing mini squats, and walking down the hall at home.  Is taking oxycodone as needed for pain. No falls in past 6 months. Pt denies N/V, B&B changes, unexplained weight fluctuation, saddle paresthesia, fever, night sweats, or unrelenting night pain at this time.   PAIN:  Are you having pain? Yes: NPRS scale: 5/10 Pain location: L knee Pain description: aching Aggravating factors: standing, walking Relieving factors: rest  PRECAUTIONS: None  WEIGHT BEARING RESTRICTIONS: No  FALLS:  Has patient fallen in last 6 months? No  LIVING ENVIRONMENT: Lives with: lives with their family Lives in: House/apartment Stairs: No Has following equipment at home: Single point cane, Environmental consultant - 2 wheeled, Electronics engineer, Grab bars, and Ramped entry  OCCUPATION: Retired  PLOF: Independent with basic ADLs  PATIENT GOALS: Be able to walk without RW  NEXT MD VISIT: 09/27/22  OBJECTIVE:   DIAGNOSTIC FINDINGS:   PATIENT SURVEYS:  FOTO 36 goal 21  COGNITION: Overall cognitive status: Within functional limits for tasks assessed     SENSATION: WFL  EDEMA:  Circumferential: L mid patella 46cm; R mid patell 41cm  MUSCLE LENGTH: Hamstrings: shortened bilat Thomas test: shortened RLE; unable to test on L  POSTURE: rounded shoulders, forward head, flexed trunk , and weight shift right  PALPATION: Swelling inhibiting patellar movement; ttp diffuse of L knee  LOWER EXTREMITY ROM:  Active ROM Right eval Left eval  Hip flexion 110/110 90/110  Hip extension ~5/10 bilat  Hip abduction    Hip adduction    Hip internal rotation 50% limited bilat  Hip external rotation    Knee flexion 128 75/78  Knee extension 0 25/22  Ankle dorsiflexion 15 14  Ankle plantarflexion    Ankle inversion    Ankle eversion     (Blank rows = not tested)  LOWER EXTREMITY MMT:  MMT Right eval Left eval  Hip flexion 4+ 2+  Hip extension 3- 2+  Hip abduction 4- 3+  Hip adduction    Hip internal rotation 5 4+  Hip external rotation 4+ 4  Knee flexion 4+ 3+   Knee extension 4+ 3  Ankle dorsiflexion 4+ 4+  Ankle plantarflexion    Ankle inversion    Ankle eversion     (Blank rows = not tested)   FUNCTIONAL TESTS:  5 times sit to stand: 40sec with UE use for push off 10 meter walk test: self selected: 0.50m/s fastest: 0.35m/s SLS unable bilat  GAIT: Distance walked: 60M Assistive device utilized: Environmental consultant - 2 wheeled Level of assistance: Modified independence Comments: decreased L stance time; decreased RLE step length, heavy reliance on RW with RLE swing    TODAY'S TREATMENT:  DATE: 09/21/22  Recumbent bike AAROM knee flex/ext seat 11, 5 mins   Supine heel slide x12 Heel prop stretch with bolster roll under heel 5# AW at distal femur x60seconds.  Femur G3-4 AP mob in above position 2x 30sec bouts Prone hanging off edge of table with 5# AW at distal lower leg 60sec  Sitting EOB traction + passive flexion x12  PROM following 15-110d AROM following 20-95d  TRX squat with feet together for increased knee flex 2x 12 with cuing to stand with "push through LLE" with good carry over  OMEGA leg press 20# x12; 30# x8    PATIENT EDUCATION:  Education details: Patient was educated on diagnosis, anatomy and pathology involved, prognosis, role of PT, and was given an HEP, demonstrating exercise with proper form following verbal and tactile cues, and was given a paper hand out to continue exercise at home. Pt was educated on and agreed to plan of care.  Person educated: Patient Education method: Explanation, Demonstration, and Verbal cues Education comprehension: verbalized understanding, returned demonstration, and verbal cues required  HOME EXERCISE PROGRAM: [id number]  ASSESSMENT:  CLINICAL IMPRESSION: Continuing PT POC with focus on improving L knee AROM and LE strengthening. Pt is able to comply with all cuing  for proper technique of therex with good motivation and no increased pain throughout session. Pt with increased passive mobility following traction with flex. PT continued to reiterate need for prop stretch in prone or supine > recliner for increased TKE with understanding. Formal reassessment to be completed next session. Pt will continue to benefit from skilled PT services to address remaining deficits in L knee to reduce falls risk and return to PLOF.     OBJECTIVE IMPAIRMENTS: carrying, lifting, bending, standing, squatting, stairs, transfers, bed mobility, bathing, dressing, hygiene/grooming, and caring for others.   ACTIVITY LIMITATIONS: carrying, lifting, bending, sitting, standing, squatting, sleeping, transfers, bed mobility, bathing, toileting, and locomotion level  PARTICIPATION LIMITATIONS: meal prep, cleaning, laundry, shopping, community activity, and yard work  PERSONAL FACTORS: Age, Fitness, Past/current experiences, Time since onset of injury/illness/exacerbation, and 3+ comorbidities: HLD, HTN, athritis, prostate cancer  are also affecting patient's functional outcome.   REHAB POTENTIAL: Good  CLINICAL DECISION MAKING: Evolving/moderate complexity  EVALUATION COMPLEXITY: Moderate   GOALS: Goals reviewed with patient? Yes  SHORT TERM GOALS: Target date: 10/08/22 Pt will be independent with HEP in order to improve strength and balance in order to decrease fall risk and improve function at home and work.  Baseline: HEP given; 10/11/22 HEP updated Goal status: ongoing   LONG TERM GOALS: Target date: 11/17/22  Pt will demonstrate L knee mobility of 0-120d in order to demonstrate mobility needed for normalized gait Baseline: 22-75; 10/11/22 20-109 Goal status: ONGOING  2.  Patient will increase FOTO score to 61 to demonstrate predicted increase in functional mobility to complete ADLs  Baseline: 36 Goal status: INITIAL  3.  Pt will demonstrate 5TSTS by at least 13 seconds  in order to demonstrate age matched LE strength needed for ind transfers  Baseline: 40sec Goal status: INITIAL  4.  Pt will increase 10MWT to at least 1.62m/s in order to demonstrate community ambulation speed with decreased fall risk   Baseline: self selected: 0.47m/s fastest: 0.23m/s Goal status: INITIAL     PLAN:  PT FREQUENCY: 1-2x/week  PT DURATION: 8 weeks  PLANNED INTERVENTIONS: Therapeutic exercises, Therapeutic activity, Neuromuscular re-education, Balance training, Gait training, Patient/Family education, Self Care, Joint mobilization, Joint manipulation, Stair training, Dry Needling, and Electrical stimulation  PLAN FOR NEXT SESSION: SPC progression and L knee AROM/strengthening   Durwin Reges 10/11/2022, 11:41 AM

## 2022-10-16 ENCOUNTER — Encounter: Payer: Medicare PPO | Admitting: Physical Therapy

## 2022-10-16 ENCOUNTER — Ambulatory Visit: Payer: Medicare PPO | Admitting: Physical Therapy

## 2022-10-16 ENCOUNTER — Encounter: Payer: Self-pay | Admitting: Physical Therapy

## 2022-10-16 DIAGNOSIS — M25562 Pain in left knee: Secondary | ICD-10-CM | POA: Diagnosis not present

## 2022-10-16 NOTE — Therapy (Signed)
OUTPATIENT PHYSICAL THERAPY LOWER EXTREMITY TREATMENT   Patient Name: Robert [name] MRN: [id number] DOB:Apr 17, 1941, 82 y.o., male Today's Date: 10/16/2022  END OF SESSION:  PT End of Session - 10/16/22 0910     Visit Number 11    Number of Visits 17    Date for PT Re-Evaluation 11/24/22    Authorization Type [employer]    Authorization Time Period 20 visits 09/15/22 - 11/24/22    Authorization - Visit Number 11    Authorization - Number of Visits 20    Progress Note Due on Visit 20    PT Start Time 0915    PT Stop Time 0955    PT Time Calculation (min) 40 min    Activity Tolerance Patient tolerated treatment well    Behavior During Therapy [facility] for tasks assessed/performed                      Past Medical History:  Diagnosis Date   Arthritis    Diverticulosis, sigmoid 08/12/2014   Moderate, noted on colonoscopy   History of adenomatous polyp of colon    Hyperlipidemia    Hypertension    followed by pcp   (03-16-2020  per pt had stress test several years ago, told ok)   Left inguinal hernia    Prostate cancer urologist-- dr Mena Goes   dx 2019,  Stage T1c, Gleason 3+4;  s/p  brachytherapy 03-08-2018   Wears glasses    Past Surgical History:  Procedure Laterality Date   COLONOSCOPY W/ POLYPECTOMY  last one 12-18-2019  dr Marina Goodell   CYSTOSCOPY N/A 03/08/2018   Procedure: CYSTOSCOPY;  Surgeon: Jerilee Field, MD;  Location: [facility];  Service: Urology;  Laterality: N/A;  no seeds in bladder per dr eskridge   INGUINAL HERNIA REPAIR Left 03/19/2020   Procedure: LEFT INGUINAL HERNIA REPAIR;  Surgeon: Sheliah Hatch, De Blanch, MD;  Location: [facility];  Service: General;  Laterality: Left;   INSERTION OF MESH Left 03/19/2020   Procedure: INSERTION OF MESH;  Surgeon: Kinsinger, De Blanch, MD;  Location: [facility];  Service: General;  Laterality: Left;   PROSTATE BIOPSY  10/16/2017   RADIOACTIVE SEED IMPLANT N/A 03/08/2018    Procedure: RADIOACTIVE SEED IMPLANT/BRACHYTHERAPY IMPLANT;  Surgeon: Jerilee Field, MD;  Location: [facility];  Service: Urology;  Laterality: N/A;  86 seeds   SPACE OAR INSTILLATION N/A 03/08/2018   Procedure: SPACE OAR INSTILLATION;  Surgeon: Jerilee Field, MD;  Location: [facility];  Service: Urology;  Laterality: N/A;   TONSILLECTOMY  child   TOTAL KNEE ARTHROPLASTY Left 08/25/2022   Procedure: LEFT TOTAL KNEE ARTHROPLASTY;  Surgeon: Kathryne Hitch, MD;  Location: [facility];  Service: Orthopedics;  Laterality: Left;   Patient Active Problem List   Diagnosis Date Noted   Status post total left knee replacement 08/25/2022   Unilateral primary osteoarthritis, left knee 06/14/2022   Malignant neoplasm of prostate 12/10/2017    PCP: Eloise Harman MD  REFERRING PROVIDER: Rosey Bath MD  REFERRING DIAG: L TKA  THERAPY DIAG:  Acute pain of left knee  Rationale for Evaluation and Treatment: Rehabilitation  ONSET DATE: 08/25/22  SUBJECTIVE:   SUBJECTIVE STATEMENT: Pt amb into clinic with [facility], reporting he is using the cane or no AD at home- is completely done with RW. "I am trying to do my exercises at least once a day but am not getting them done as much as I am supported.  PERTINENT HISTORY: Pt is an 82 year old male presenting s/p L TKA 08/25/22. PLOF ambulation without AD, complete ind with all ADLs. Currently ambulating with RW in the home and community. Lives at home with wife, with family in the area. His daughter is staying with him to make meals, clean, drive, etc. Reports he is dressing himself though he does need assistance from daughter to don/doff ted hose. Home with ramped entry, single level, walk in shower with seat and grab bars, and standard height toilet seat. Current pain level 5/10 best: /10 worst 8/10. Pt has had HHPT since surgery, and is doing mini squats, and walking down the hall at home. Is taking oxycodone as needed for pain. No  falls in past 6 months. Pt denies N/V, B&B changes, unexplained weight fluctuation, saddle paresthesia, fever, night sweats, or unrelenting night pain at this time.   PAIN:  Are you having pain? Yes: NPRS scale: 5/10 Pain location: L knee Pain description: aching Aggravating factors: standing, walking Relieving factors: rest  PRECAUTIONS: None  WEIGHT BEARING RESTRICTIONS: No  FALLS:  Has patient fallen in last 6 months? No  LIVING ENVIRONMENT: Lives with: lives with their family Lives in: House/apartment Stairs: No Has following equipment at home: Single point cane, Environmental consultant - 2 wheeled, Tour manager, Grab bars, and Ramped entry  OCCUPATION: Retired  PLOF: Independent with basic ADLs  PATIENT GOALS: Be able to walk without RW  NEXT MD VISIT: 09/27/22  OBJECTIVE:   DIAGNOSTIC FINDINGS:   PATIENT SURVEYS:  FOTO 36 goal 42  COGNITION: Overall cognitive status: Within functional limits for tasks assessed     SENSATION: WFL  EDEMA:  Circumferential: L mid patella 46cm; R mid patell 41cm  MUSCLE LENGTH: Hamstrings: shortened bilat Thomas test: shortened RLE; unable to test on L  POSTURE: rounded shoulders, forward head, flexed trunk , and weight shift right  PALPATION: Swelling inhibiting patellar movement; ttp diffuse of L knee  LOWER EXTREMITY ROM:  Active ROM Right eval Left eval  Hip flexion 110/110 90/110  Hip extension ~5/10 bilat  Hip abduction    Hip adduction    Hip internal rotation 50% limited bilat  Hip external rotation    Knee flexion 128 75/78  Knee extension 0 25/22  Ankle dorsiflexion 15 14  Ankle plantarflexion    Ankle inversion    Ankle eversion     (Blank rows = not tested)  LOWER EXTREMITY MMT:  MMT Right eval Left eval  Hip flexion 4+ 2+  Hip extension 3- 2+  Hip abduction 4- 3+  Hip adduction    Hip internal rotation 5 4+  Hip external rotation 4+ 4  Knee flexion 4+ 3+  Knee extension 4+ 3  Ankle dorsiflexion 4+  4+  Ankle plantarflexion    Ankle inversion    Ankle eversion     (Blank rows = not tested)   FUNCTIONAL TESTS:  5 times sit to stand: 40sec with UE use for push off 10 meter walk test: self selected: 0.10m/s fastest: 0.3m/s SLS unable bilat  GAIT: Distance walked: 50M Assistive device utilized: Environmental consultant - 2 wheeled Level of assistance: Modified independence Comments: decreased L stance time; decreased RLE step length, heavy reliance on RW with RLE swing    TODAY'S TREATMENT:  DATE: 10/16/22  Recumbent bike AAROM knee flex/ext seat 10, 5 mins   Supine heel slide x12 Heel prop stretch with bolster roll under heel 5# AW at distal femur x60seconds.  Femur G3-4 AP mob in above position 2x 30sec bouts Prone hanging off edge of table with 5# AW at distal lower leg 60sec  Sitting EOB traction + passive flexion x12    TRX squat with feet together for increased knee flex x15 with cuing to stand with "push through LLE" with good carry over  STS from chair with RLE on balance stone 2x 10/8 with no UE support  OMEGA leg press 35# 2x 10    PATIENT EDUCATION:  Education details: Patient was educated on diagnosis, anatomy and pathology involved, prognosis, role of PT, and was given an HEP, demonstrating exercise with proper form following verbal and tactile cues, and was given a paper hand out to continue exercise at home. Pt was educated on and agreed to plan of care.  Person educated: Patient Education method: Explanation, Demonstration, and Verbal cues Education comprehension: verbalized understanding, returned demonstration, and verbal cues required  HOME EXERCISE PROGRAM: [id number]  ASSESSMENT:  CLINICAL IMPRESSION: Continuing PT POC with focus on improving L knee AROM and LE strengthening. Pt is able to comply with all cuing for proper technique of therex  with good motivation and no increased pain throughout session. Pt with increased passive mobility following traction with flex. PT continued to reiterate need for prop stretch in prone or supine > recliner for increased TKE with understanding. Formal reassessment to be completed next session. Pt will continue to benefit from skilled PT services to address remaining deficits in L knee to reduce falls risk and return to PLOF.     OBJECTIVE IMPAIRMENTS: carrying, lifting, bending, standing, squatting, stairs, transfers, bed mobility, bathing, dressing, hygiene/grooming, and caring for others.   ACTIVITY LIMITATIONS: carrying, lifting, bending, sitting, standing, squatting, sleeping, transfers, bed mobility, bathing, toileting, and locomotion level  PARTICIPATION LIMITATIONS: meal prep, cleaning, laundry, shopping, community activity, and yard work  PERSONAL FACTORS: Age, Fitness, Past/current experiences, Time since onset of injury/illness/exacerbation, and 3+ comorbidities: HLD, HTN, athritis, prostate cancer  are also affecting patient's functional outcome.   REHAB POTENTIAL: Good  CLINICAL DECISION MAKING: Evolving/moderate complexity  EVALUATION COMPLEXITY: Moderate   GOALS: Goals reviewed with patient? Yes  SHORT TERM GOALS: Target date: 10/08/22 Pt will be independent with HEP in order to improve strength and balance in order to decrease fall risk and improve function at home and work.  Baseline: HEP given; 10/11/22 HEP updated Goal status: ongoing   LONG TERM GOALS: Target date: 11/17/22  Pt will demonstrate L knee mobility of 0-120d in order to demonstrate mobility needed for normalized gait Baseline: 22-75; 10/11/22 20-109 Goal status: ONGOING  2.  Patient will increase FOTO score to 61 to demonstrate predicted increase in functional mobility to complete ADLs  Baseline: 36 Goal status: INITIAL  3.  Pt will demonstrate 5TSTS by at least 13 seconds in order to demonstrate age  matched LE strength needed for ind transfers  Baseline: 40sec Goal status: INITIAL  4.  Pt will increase to at least 1.57m/s in order to demonstrate community ambulation speed with decreased fall risk   Baseline: self selected: 0.87m/s fastest: 0.78m/s Goal status: INITIAL     PLAN:  PT FREQUENCY: 1-2x/week  PT DURATION: 8 weeks  PLANNED INTERVENTIONS: Therapeutic exercises, Therapeutic activity, Neuromuscular re-education, Balance training, Gait training, Patient/Family education, Self Care, Joint mobilization, Joint  manipulation, Stair training, Dry Needling, and Electrical stimulation  PLAN FOR NEXT SESSION: SPC progression and L knee AROM/strengthening   Hilda Liashelsea Vivia Rosenburg 10/16/2022, 10:07 AM

## 2022-10-18 ENCOUNTER — Ambulatory Visit: Payer: Medicare PPO

## 2022-10-18 DIAGNOSIS — M25562 Pain in left knee: Secondary | ICD-10-CM

## 2022-10-18 NOTE — Therapy (Signed)
OUTPATIENT PHYSICAL THERAPY LOWER EXTREMITY TREATMENT   Patient Name: Robert [name] MRN: [id number] DOB:1941/05/31, 82 y.o., male Today's Date: 10/18/2022  END OF SESSION:  PT End of Session - 10/18/22 0918     Visit Number 12    Number of Visits 17    Date for PT Re-Evaluation 11/24/22    Authorization Type [employer]    Authorization Time Period 20 visits 09/15/22 - 11/24/22    Authorization - Visit Number 12    Authorization - Number of Visits 20    Progress Note Due on Visit 20    PT Start Time 0916    PT Stop Time 1000    PT Time Calculation (min) 44 min    Activity Tolerance Patient tolerated treatment well    Behavior During Therapy [facility] for tasks assessed/performed                       Past Medical History:  Diagnosis Date   Arthritis    Diverticulosis, sigmoid 08/12/2014   Moderate, noted on colonoscopy   History of adenomatous polyp of colon    Hyperlipidemia    Hypertension    followed by pcp   (03-16-2020  per pt had stress test several years ago, told ok)   Left inguinal hernia    Prostate cancer urologist-- dr Mena Goes   dx 2019,  Stage T1c, Gleason 3+4;  s/p  brachytherapy 03-08-2018   Wears glasses    Past Surgical History:  Procedure Laterality Date   COLONOSCOPY W/ POLYPECTOMY  last one 12-18-2019  dr Marina Goodell   CYSTOSCOPY N/A 03/08/2018   Procedure: CYSTOSCOPY;  Surgeon: Jerilee Field, MD;  Location: [facility];  Service: Urology;  Laterality: N/A;  no seeds in bladder per dr eskridge   INGUINAL HERNIA REPAIR Left 03/19/2020   Procedure: LEFT INGUINAL HERNIA REPAIR;  Surgeon: Sheliah Hatch, De Blanch, MD;  Location: [facility];  Service: General;  Laterality: Left;   INSERTION OF MESH Left 03/19/2020   Procedure: INSERTION OF MESH;  Surgeon: Kinsinger, De Blanch, MD;  Location: [facility];  Service: General;  Laterality: Left;   PROSTATE BIOPSY  10/16/2017   RADIOACTIVE SEED IMPLANT N/A 03/08/2018    Procedure: RADIOACTIVE SEED IMPLANT/BRACHYTHERAPY IMPLANT;  Surgeon: Jerilee Field, MD;  Location: [facility];  Service: Urology;  Laterality: N/A;  86 seeds   SPACE OAR INSTILLATION N/A 03/08/2018   Procedure: SPACE OAR INSTILLATION;  Surgeon: Jerilee Field, MD;  Location: [facility];  Service: Urology;  Laterality: N/A;   TONSILLECTOMY  child   TOTAL KNEE ARTHROPLASTY Left 08/25/2022   Procedure: LEFT TOTAL KNEE ARTHROPLASTY;  Surgeon: Kathryne Hitch, MD;  Location: [facility];  Service: Orthopedics;  Laterality: Left;   Patient Active Problem List   Diagnosis Date Noted   Status post total left knee replacement 08/25/2022   Unilateral primary osteoarthritis, left knee 06/14/2022   Malignant neoplasm of prostate 12/10/2017    PCP: Eloise Harman MD  REFERRING PROVIDER: Rosey Bath MD  REFERRING DIAG: L TKA  THERAPY DIAG:  Acute pain of left knee  Rationale for Evaluation and Treatment: Rehabilitation  ONSET DATE: 08/25/22  SUBJECTIVE:   SUBJECTIVE STATEMENT:  Pt ambulates into the clinic with SPC.  Pt is really concerned about having to have a manipulation or having to undergo another surgery.  PERTINENT HISTORY: Pt is an 82 year old male presenting s/p L TKA 08/25/22. PLOF ambulation without AD, complete ind with  all ADLs. Currently ambulating with RW in the home and community. Lives at home with wife, with family in the area. His daughter is staying with him to make meals, clean, drive, etc. Reports he is dressing himself though he does need assistance from daughter to don/doff ted hose. Home with ramped entry, single level, walk in shower with seat and grab bars, and standard height toilet seat. Current pain level 5/10 best: /10 worst 8/10. Pt has had HHPT since surgery, and is doing mini squats, and walking down the hall at home. Is taking oxycodone as needed for pain. No falls in past 6 months. Pt denies N/V, B&B changes, unexplained weight  fluctuation, saddle paresthesia, fever, night sweats, or unrelenting night pain at this time.   PAIN:  Are you having pain? Yes: NPRS scale: 5/10 Pain location: L knee Pain description: aching Aggravating factors: standing, walking Relieving factors: rest  PRECAUTIONS: None  WEIGHT BEARING RESTRICTIONS: No  FALLS:  Has patient fallen in last 6 months? No  LIVING ENVIRONMENT: Lives with: lives with their family Lives in: House/apartment Stairs: No Has following equipment at home: Single point cane, Environmental consultant - 2 wheeled, Tour manager, Grab bars, and Ramped entry  OCCUPATION: Retired  PLOF: Independent with basic ADLs  PATIENT GOALS: Be able to walk without RW  NEXT MD VISIT: 10/30/22  OBJECTIVE:   DIAGNOSTIC FINDINGS:   PATIENT SURVEYS:  FOTO 36 goal 89  COGNITION: Overall cognitive status: Within functional limits for tasks assessed     SENSATION: WFL  EDEMA:  Circumferential: L mid patella 46cm; R mid patell 41cm  MUSCLE LENGTH: Hamstrings: shortened bilat Thomas test: shortened RLE; unable to test on L  POSTURE: rounded shoulders, forward head, flexed trunk , and weight shift right  PALPATION: Swelling inhibiting patellar movement; ttp diffuse of L knee  LOWER EXTREMITY ROM:  Active ROM Right eval Left eval  Hip flexion 110/110 90/110  Hip extension ~5/10 bilat  Hip abduction    Hip adduction    Hip internal rotation 50% limited bilat  Hip external rotation    Knee flexion 128 75/78  Knee extension 0 25/22  Ankle dorsiflexion 15 14  Ankle plantarflexion    Ankle inversion    Ankle eversion     (Blank rows = not tested)  LOWER EXTREMITY MMT:  MMT Right eval Left eval  Hip flexion 4+ 2+  Hip extension 3- 2+  Hip abduction 4- 3+  Hip adduction    Hip internal rotation 5 4+  Hip external rotation 4+ 4  Knee flexion 4+ 3+  Knee extension 4+ 3  Ankle dorsiflexion 4+ 4+  Ankle plantarflexion    Ankle inversion    Ankle eversion      (Blank rows = not tested)   FUNCTIONAL TESTS:  5 times sit to stand: 40sec with UE use for push off 10 meter walk test: self selected: 0.55m/s fastest: 0.74m/s SLS unable bilat  GAIT: Distance walked: 81M Assistive device utilized: Environmental consultant - 2 wheeled Level of assistance: Modified independence Comments: decreased L stance time; decreased RLE step length, heavy reliance on RW with RLE swing    TODAY'S TREATMENT: DATE: 10/18/22   TherEx:  Recumbent bike AAROM knee flex/ext seat 11 and 12, pt performed pedaling forward/backward, 5 mins   Supine heel slide x15 Heel prop stretch with bolster roll under heel 7.5# AW at distal femur x60seconds.  Femur G3-4 AP mob in above position 2x 30sec bouts Prone hanging off edge of table with 7.5#  AW at distal lower leg 60sec  Prone contract/relax technique applied during knee flexion, 15 sec x 4 bouts Sitting EOB traction + passive flexion x12    TRX squat with chair placed behind him for tactile feedback, x5 attempts Chair removed and continued with TRX squat, x10 with CGA assist to assist if necessary     PATIENT EDUCATION:  Education details: Patient was educated on diagnosis, anatomy and pathology involved, prognosis, role of PT, and was given an HEP, demonstrating exercise with proper form following verbal and tactile cues, and was given a paper hand out to continue exercise at home. Pt was educated on and agreed to plan of care.  Person educated: Patient Education method: Explanation, Demonstration, and Verbal cues Education comprehension: verbalized understanding, returned demonstration, and verbal cues required  HOME EXERCISE PROGRAM: [id number]  ASSESSMENT:  CLINICAL IMPRESSION:   Pt responded well to the exercises and he self reports that the overpressure applied to the knee by the therapist was beneficial for increasing his ROM.  Pt still lacking significant amount of knee extension at this time, noting a lacking of 12 deg  of extension and 104 knee flexion with overpressure.  Pt advised to continue to focus on knee extension.  Pt unsure of knee manipulation and is worried that he might have to have that performed.   Pt will continue to benefit from skilled therapy to address remaining deficits in order to improve overall QoL and return to PLOF.      OBJECTIVE IMPAIRMENTS: carrying, lifting, bending, standing, squatting, stairs, transfers, bed mobility, bathing, dressing, hygiene/grooming, and caring for others.   ACTIVITY LIMITATIONS: carrying, lifting, bending, sitting, standing, squatting, sleeping, transfers, bed mobility, bathing, toileting, and locomotion level  PARTICIPATION LIMITATIONS: meal prep, cleaning, laundry, shopping, community activity, and yard work  PERSONAL FACTORS: Age, Fitness, Past/current experiences, Time since onset of injury/illness/exacerbation, and 3+ comorbidities: HLD, HTN, athritis, prostate cancer  are also affecting patient's functional outcome.   REHAB POTENTIAL: Good  CLINICAL DECISION MAKING: Evolving/moderate complexity  EVALUATION COMPLEXITY: Moderate   GOALS: Goals reviewed with patient? Yes  SHORT TERM GOALS: Target date: 10/08/22 Pt will be independent with HEP in order to improve strength and balance in order to decrease fall risk and improve function at home and work.  Baseline: HEP given; 10/11/22 HEP updated Goal status: ongoing   LONG TERM GOALS: Target date: 11/17/22  Pt will demonstrate L knee mobility of 0-120d in order to demonstrate mobility needed for normalized gait Baseline: 22-75; 10/11/22 20-109 Goal status: ONGOING  2.  Patient will increase FOTO score to 61 to demonstrate predicted increase in functional mobility to complete ADLs  Baseline: 36 Goal status: INITIAL  3.  Pt will demonstrate 5TSTS by at least 13 seconds in order to demonstrate age matched LE strength needed for ind transfers  Baseline: 40sec Goal status: INITIAL  4.  Pt will  increase 10MWT to at least 1.3827m/s in order to demonstrate community ambulation speed with decreased fall risk   Baseline: self selected: 0.4736m/s fastest: 0.5536m/s Goal status: INITIAL     PLAN:  PT FREQUENCY: 1-2x/week  PT DURATION: 8 weeks  PLANNED INTERVENTIONS: Therapeutic exercises, Therapeutic activity, Neuromuscular re-education, Balance training, Gait training, Patient/Family education, Self Care, Joint mobilization, Joint manipulation, Stair training, Dry Needling, and Electrical stimulation  PLAN FOR NEXT SESSION:   SPC progression and L knee AROM/strengthening; continue to add manual approach to see if progress is made.   Nolon BussingJoshua Lonnie Rosado, PT, DPT Physical Therapist - [facility]  10/18/22, 9:18 AM

## 2022-10-19 ENCOUNTER — Encounter: Payer: Medicare PPO | Admitting: Physical Therapy

## 2022-10-23 ENCOUNTER — Encounter: Payer: Medicare PPO | Admitting: Physical Therapy

## 2022-10-23 ENCOUNTER — Ambulatory Visit: Payer: Medicare PPO

## 2022-10-23 DIAGNOSIS — M25562 Pain in left knee: Secondary | ICD-10-CM | POA: Diagnosis not present

## 2022-10-23 NOTE — Therapy (Signed)
OUTPATIENT PHYSICAL THERAPY LOWER EXTREMITY TREATMENT   Patient Name: Robert [name] MRN: [id number] DOB:1941/04/08, 82 y.o., male Today's Date: 10/23/2022  END OF SESSION:  PT End of Session - 10/23/22 0919     Visit Number 13    Number of Visits 17    Date for PT Re-Evaluation 11/24/22    Authorization Type [employer]    Authorization Time Period 20 visits 09/15/22 - 11/24/22    Authorization - Number of Visits 20    Progress Note Due on Visit 20    PT Start Time 0918    PT Stop Time 1000    PT Time Calculation (min) 42 min    Activity Tolerance Patient tolerated treatment well    Behavior During Therapy [facility] for tasks assessed/performed              Past Medical History:  Diagnosis Date   Arthritis    Diverticulosis, sigmoid 08/12/2014   Moderate, noted on colonoscopy   History of adenomatous polyp of colon    Hyperlipidemia    Hypertension    followed by pcp   (03-16-2020  per pt had stress test several years ago, told ok)   Left inguinal hernia    Prostate cancer urologist-- dr Mena Goes   dx 2019,  Stage T1c, Gleason 3+4;  s/p  brachytherapy 03-08-2018   Wears glasses    Past Surgical History:  Procedure Laterality Date   COLONOSCOPY W/ POLYPECTOMY  last one 12-18-2019  dr Marina Goodell   CYSTOSCOPY N/A 03/08/2018   Procedure: CYSTOSCOPY;  Surgeon: Jerilee Field, MD;  Location[facility];  Service: Urology;  Laterality: N/A;  no seeds in bladder per dr eskridge   INGUINAL HERNIA REPAIR Left 03/19/2020   Procedure: LEFT INGUINAL HERNIA REPAIR;  Surgeon: Sheliah Hatch, De Blanch, MD;  Location: [facility];  Service: General;  Laterality: Left;   INSERTION OF MESH Left 03/19/2020   Procedure: INSERTION OF MESH;  Surgeon: Kinsinger, De Blanch, MD;  Location: [facility];  Service: General;  Laterality: Left;   PROSTATE BIOPSY  10/16/2017   RADIOACTIVE SEED IMPLANT N/A 03/08/2018   Procedure: RADIOACTIVE SEED IMPLANT/BRACHYTHERAPY  IMPLANT;  Surgeon: Jerilee Field, MD;  Location: [facility] ;  Service: Urology;  Laterality: N/A;  86 seeds   SPACE OAR INSTILLATION N/A 03/08/2018   Procedure: SPACE OAR INSTILLATION;  Surgeon: Jerilee Field, MD;  Location: [facility];  Service: Urology;  Laterality: N/A;   TONSILLECTOMY  child   TOTAL KNEE ARTHROPLASTY Left 08/25/2022   Procedure: LEFT TOTAL KNEE ARTHROPLASTY;  Surgeon: Kathryne Hitch, MD;  Location: [facility];  Service: Orthopedics;  Laterality: Left;   Patient Active Problem List   Diagnosis Date Noted   Status post total left knee replacement 08/25/2022   Unilateral primary osteoarthritis, left knee 06/14/2022   Malignant neoplasm of prostate 12/10/2017    PCP: Eloise Harman MD  REFERRING PROVIDER: Rosey Bath MD  REFERRING DIAG: L TKA  THERAPY DIAG:  Acute pain of left knee  Rationale for Evaluation and Treatment: Rehabilitation  ONSET DATE: 08/25/22  SUBJECTIVE:   SUBJECTIVE STATEMENT:  Pt reports he is doing well and is ready to begin therapy.     PERTINENT HISTORY:  Pt is an 82 year old male presenting s/p L TKA 08/25/22. PLOF ambulation without AD, complete ind with all ADLs. Currently ambulating with RW in the home and community. Lives at home with wife, with family in the area. His daughter is staying  with him to make meals, clean, drive, etc. Reports he is dressing himself though he does need assistance from daughter to don/doff ted hose. Home with ramped entry, single level, walk in shower with seat and grab bars, and standard height toilet seat. Current pain level 5/10 best: /10 worst 8/10. Pt has had HHPT since surgery, and is doing mini squats, and walking down the hall at home. Is taking oxycodone as needed for pain. No falls in past 6 months. Pt denies N/V, B&B changes, unexplained weight fluctuation, saddle paresthesia, fever, night sweats, or unrelenting night pain at this time.   PAIN:  Are you having  pain? Yes: NPRS scale: 5/10 Pain location: L knee Pain description: aching Aggravating factors: standing, walking Relieving factors: rest  PRECAUTIONS: None  WEIGHT BEARING RESTRICTIONS: No  FALLS:  Has patient fallen in last 6 months? No  LIVING ENVIRONMENT: Lives with: lives with their family Lives in: House/apartment Stairs: No Has following equipment at home: Single point cane, Environmental consultant - 2 wheeled, Tour manager, Grab bars, and Ramped entry  OCCUPATION: Retired  PLOF: Independent with basic ADLs  PATIENT GOALS: Be able to walk without RW  NEXT MD VISIT: 10/30/22  OBJECTIVE:   DIAGNOSTIC FINDINGS:   PATIENT SURVEYS:  FOTO 36 goal 73  COGNITION: Overall cognitive status: Within functional limits for tasks assessed     SENSATION: WFL  EDEMA:  Circumferential: L mid patella 46cm; R mid patell 41cm  MUSCLE LENGTH: Hamstrings: shortened bilat Thomas test: shortened RLE; unable to test on L  POSTURE: rounded shoulders, forward head, flexed trunk , and weight shift right  PALPATION: Swelling inhibiting patellar movement; ttp diffuse of L knee  LOWER EXTREMITY ROM:  Active ROM Right eval Left eval  Hip flexion 110/110 90/110  Hip extension ~5/10 bilat  Hip abduction    Hip adduction    Hip internal rotation 50% limited bilat  Hip external rotation    Knee flexion 128 75/78  Knee extension 0 25/22  Ankle dorsiflexion 15 14  Ankle plantarflexion    Ankle inversion    Ankle eversion     (Blank rows = not tested)  LOWER EXTREMITY MMT:  MMT Right eval Left eval  Hip flexion 4+ 2+  Hip extension 3- 2+  Hip abduction 4- 3+  Hip adduction    Hip internal rotation 5 4+  Hip external rotation 4+ 4  Knee flexion 4+ 3+  Knee extension 4+ 3  Ankle dorsiflexion 4+ 4+  Ankle plantarflexion    Ankle inversion    Ankle eversion     (Blank rows = not tested)   FUNCTIONAL TESTS:  5 times sit to stand: 40sec with UE use for push off 10 meter walk test:  self selected: 0.25m/s fastest: 0.46m/s SLS unable bilat  GAIT: Distance walked: 41M Assistive device utilized: Environmental consultant - 2 wheeled Level of assistance: Modified independence Comments: decreased L stance time; decreased RLE step length, heavy reliance on RW with RLE swing    TODAY'S TREATMENT: DATE: 10/23/22  TherEx:  Recumbent bike AAROM knee flex/ext seat 11 and 12, pt performed pedaling forward/backward, 8 mins   Supine heel slide x15 Heel prop stretch with bolster roll under heel 7.5# AW at distal femur x60seconds.  Femur G3-4 AP mob in above position 2x 30sec bouts TRX squat with chair placed behind him for tactile feedback, x5 attempts Chair removed and continued with TRX squat, x10 with CGA assist to assist if necessary Squats on total gym at level 26,  x10 Seated leg press, BLE, 85#, 2x10  L Knee Flexion with overpressure: 114 deg L Knee Flexion AROM: 95 deg L Knee Extension with overpressure: 14 deg lacking L Knee Extension AROM: 18 deg lacking   PATIENT EDUCATION:  Education details: Patient was educated on diagnosis, anatomy and pathology involved, prognosis, role of PT, and was given an HEP, demonstrating exercise with proper form following verbal and tactile cues, and was given a paper hand out to continue exercise at home. Pt was educated on and agreed to plan of care.  Person educated: Patient Education method: Explanation, Demonstration, and Verbal cues Education comprehension: verbalized understanding, returned demonstration, and verbal cues required  HOME EXERCISE PROGRAM: [id number]  ASSESSMENT:  CLINICAL IMPRESSION:  Pt put forth great effort throughout the entirety of the session.  Pt is making some improvements on his ROM, however still lacks functional knee extension.  Pt is concerned about needing a manipulation and perseverates on that at times.  Pt is able to functionally mobilize, just not without limp or with normalized gait pattern at this time.    Pt will continue to benefit from skilled therapy to address remaining deficits in order to improve overall QoL and return to PLOF.     OBJECTIVE IMPAIRMENTS: carrying, lifting, bending, standing, squatting, stairs, transfers, bed mobility, bathing, dressing, hygiene/grooming, and caring for others.   ACTIVITY LIMITATIONS: carrying, lifting, bending, sitting, standing, squatting, sleeping, transfers, bed mobility, bathing, toileting, and locomotion level  PARTICIPATION LIMITATIONS: meal prep, cleaning, laundry, shopping, community activity, and yard work  PERSONAL FACTORS: Age, Fitness, Past/current experiences, Time since onset of injury/illness/exacerbation, and 3+ comorbidities: HLD, HTN, athritis, prostate cancer  are also affecting patient's functional outcome.   REHAB POTENTIAL: Good  CLINICAL DECISION MAKING: Evolving/moderate complexity  EVALUATION COMPLEXITY: Moderate   GOALS: Goals reviewed with patient? Yes  SHORT TERM GOALS: Target date: 10/08/22 Pt will be independent with HEP in order to improve strength and balance in order to decrease fall risk and improve function at home and work.  Baseline: HEP given; 10/11/22 HEP updated Goal status: ongoing   LONG TERM GOALS: Target date: 11/17/22  Pt will demonstrate L knee mobility of 0-120d in order to demonstrate mobility needed for normalized gait Baseline: 22-75; 10/11/22 20-109 Goal status: ONGOING  2.  Patient will increase FOTO score to 61 to demonstrate predicted increase in functional mobility to complete ADLs  Baseline: 36 Goal status: INITIAL  3.  Pt will demonstrate 5TSTS by at least 13 seconds in order to demonstrate age matched LE strength needed for ind transfers  Baseline: 40sec Goal status: INITIAL  4.  Pt will increase to at least 1.36m/s in order to demonstrate community ambulation speed with decreased fall risk   Baseline: self selected: 0.38m/s fastest: 0.17m/s Goal status:  INITIAL     PLAN:  PT FREQUENCY: 1-2x/week  PT DURATION: 8 weeks  PLANNED INTERVENTIONS: Therapeutic exercises, Therapeutic activity, Neuromuscular re-education, Balance training, Gait training, Patient/Family education, Self Care, Joint mobilization, Joint manipulation, Stair training, Dry Needling, and Electrical stimulation  PLAN FOR NEXT SESSION:   SPC progression and L knee AROM/strengthening; continue to add manual approach to see if progress is made.   Nolon Bussing, PT, DPT Physical Therapist - [facility]  10/23/22, 1:22 PM

## 2022-10-25 ENCOUNTER — Ambulatory Visit: Payer: Medicare PPO

## 2022-10-25 DIAGNOSIS — M25562 Pain in left knee: Secondary | ICD-10-CM | POA: Diagnosis not present

## 2022-10-25 NOTE — Therapy (Signed)
OUTPATIENT PHYSICAL THERAPY LOWER EXTREMITY TREATMENT   Patient Name: Robert [name] MRN: [id number] DOB:01/04/1941, 82 y.o., male Today's Date: 10/25/2022   END OF SESSION:  PT End of Session - 10/25/22 0922     Visit Number 14    Number of Visits 17    Date for PT Re-Evaluation 11/24/22    Authorization Type [employer]    Authorization Time Period 20 visits 09/15/22 - 11/24/22    Authorization - Visit Number 14    Authorization - Number of Visits 20    Progress Note Due on Visit 20    PT Start Time 0916    PT Stop Time 1000    PT Time Calculation (min) 44 min    Equipment Utilized During Treatment Gait belt    Activity Tolerance Patient tolerated treatment well    Behavior During Therapy WFL for tasks assessed/performed              Past Medical History:  Diagnosis Date   Arthritis    Diverticulosis, sigmoid 08/12/2014   Moderate, noted on colonoscopy   History of adenomatous polyp of colon    Hyperlipidemia    Hypertension    followed by pcp   (03-16-2020  per pt had stress test several years ago, told ok)   Left inguinal hernia    Prostate cancer urologist-- dr Mena Goes   dx 2019,  Stage T1c, Gleason 3+4;  s/p  brachytherapy 03-08-2018   Wears glasses    Past Surgical History:  Procedure Laterality Date   COLONOSCOPY W/ POLYPECTOMY  last one 12-18-2019  dr Marina Goodell   CYSTOSCOPY N/A 03/08/2018   Procedure: CYSTOSCOPY;  Surgeon: Jerilee Field, MD;  Location: [facility];  Service: Urology;  Laterality: N/A;  no seeds in bladder per dr eskridge   INGUINAL HERNIA REPAIR Left 03/19/2020   Procedure: LEFT INGUINAL HERNIA REPAIR;  Surgeon: Sheliah Hatch, De Blanch, MD;  Location: [facility];  Service: General;  Laterality: Left;   INSERTION OF MESH Left 03/19/2020   Procedure: INSERTION OF MESH;  Surgeon: Kinsinger, De Blanch, MD;  Location: [facility];  Service: General;  Laterality: Left;   PROSTATE BIOPSY  10/16/2017    RADIOACTIVE SEED IMPLANT N/A 03/08/2018   Procedure: RADIOACTIVE SEED IMPLANT/BRACHYTHERAPY IMPLANT;  Surgeon: Jerilee Field, MD;  Location: [facility];  Service: Urology;  Laterality: N/A;  86 seeds   SPACE OAR INSTILLATION N/A 03/08/2018   Procedure: SPACE OAR INSTILLATION;  Surgeon: Jerilee Field, MD;  Location: [facility];  Service: Urology;  Laterality: N/A;   TONSILLECTOMY  child   TOTAL KNEE ARTHROPLASTY Left 08/25/2022   Procedure: LEFT TOTAL KNEE ARTHROPLASTY;  Surgeon: Kathryne Hitch, MD;  Location: [facility];  Service: Orthopedics;  Laterality: Left;   Patient Active Problem List   Diagnosis Date Noted   Status post total left knee replacement 08/25/2022   Unilateral primary osteoarthritis, left knee 06/14/2022   Malignant neoplasm of prostate 12/10/2017    PCP: Eloise Harman MD  REFERRING PROVIDER: Rosey Bath MD  REFERRING DIAG: L TKA  THERAPY DIAG:  Acute pain of left knee  Rationale for Evaluation and Treatment: Rehabilitation  ONSET DATE: 08/25/22  SUBJECTIVE:   SUBJECTIVE STATEMENT:  Pt reports he went to the grocery store and had some fatigue afterwards, but is overall doing well.  Pt feels as though he is walking better.     PERTINENT HISTORY:  Pt is an 82 year old male presenting s/p L TKA  08/25/22. PLOF ambulation without AD, complete ind with all ADLs. Currently ambulating with RW in the home and community. Lives at home with wife, with family in the area. His daughter is staying with him to make meals, clean, drive, etc. Reports he is dressing himself though he does need assistance from daughter to don/doff ted hose. Home with ramped entry, single level, walk in shower with seat and grab bars, and standard height toilet seat. Current pain level 5/10 best: /10 worst 8/10. Pt has had HHPT since surgery, and is doing mini squats, and walking down the hall at home. Is taking oxycodone as needed for pain. No falls in past 6  months. Pt denies N/V, B&B changes, unexplained weight fluctuation, saddle paresthesia, fever, night sweats, or unrelenting night pain at this time.   PAIN:  Are you having pain? Yes: NPRS scale: 5/10 Pain location: L knee Pain description: aching Aggravating factors: standing, walking Relieving factors: rest  PRECAUTIONS: None  WEIGHT BEARING RESTRICTIONS: No  FALLS:  Has patient fallen in last 6 months? No  LIVING ENVIRONMENT: Lives with: lives with their family Lives in: House/apartment Stairs: No Has following equipment at home: Single point cane, Environmental consultant - 2 wheeled, Tour manager, Grab bars, and Ramped entry  OCCUPATION: Retired  PLOF: Independent with basic ADLs  PATIENT GOALS: Be able to walk without RW  NEXT MD VISIT: 10/30/22  OBJECTIVE:   DIAGNOSTIC FINDINGS:   PATIENT SURVEYS:  FOTO 36 goal 35  COGNITION: Overall cognitive status: Within functional limits for tasks assessed     SENSATION: WFL  EDEMA:  Circumferential: L mid patella 46cm; R mid patell 41cm  MUSCLE LENGTH: Hamstrings: shortened bilat Thomas test: shortened RLE; unable to test on L  POSTURE: rounded shoulders, forward head, flexed trunk , and weight shift right  PALPATION: Swelling inhibiting patellar movement; ttp diffuse of L knee  LOWER EXTREMITY ROM:  Active ROM Right eval Left eval  Hip flexion 110/110 90/110  Hip extension ~5/10 bilat  Hip abduction    Hip adduction    Hip internal rotation 50% limited bilat  Hip external rotation    Knee flexion 128 75/78  Knee extension 0 25/22  Ankle dorsiflexion 15 14  Ankle plantarflexion    Ankle inversion    Ankle eversion     (Blank rows = not tested)  LOWER EXTREMITY MMT:  MMT Right eval Left eval  Hip flexion 4+ 2+  Hip extension 3- 2+  Hip abduction 4- 3+  Hip adduction    Hip internal rotation 5 4+  Hip external rotation 4+ 4  Knee flexion 4+ 3+  Knee extension 4+ 3  Ankle dorsiflexion 4+ 4+  Ankle  plantarflexion    Ankle inversion    Ankle eversion     (Blank rows = not tested)   FUNCTIONAL TESTS:  5 times sit to stand: 40sec with UE use for push off 10 meter walk test: self selected: 0.63m/s fastest: 0.31m/s SLS unable bilat  GAIT: Distance walked: 58M Assistive device utilized: Environmental consultant - 2 wheeled Level of assistance: Modified independence Comments: decreased L stance time; decreased RLE step length, heavy reliance on RW with RLE swing    TODAY'S TREATMENT: DATE: 10/25/22  TherEx:  Recumbent bike AAROM knee flex/ext seat 11, pt performed pedaling forward/backward, 8 mins  Supine heel slide x15 Supine SAQ with blue bolster under knee, 2x15 2 sec holds Heel prop stretch with bolster roll under heel 7.5# AW at distal femur x60seconds.  Femur G3-4 AP  mob in above position 2x 30sec bouts TRX squat with chair placed behind him for tactile feedback, x5 attempts  Seated leg press, BLE, 85#, 2x10  Prone hamstring stretch with 5# AW at distal LE, LE hanging off edge of plinth, 60 sec bouts, slight overpressure applied Prone contract relax technique applied to the L LE for increased flexion, x10 bouts  L Knee Flexion with overpressure: 114 deg L Knee Flexion AROM: 117 deg L Knee Extension with overpressure: 13 deg lacking L Knee Extension AROM: 14 deg lacking    PATIENT EDUCATION:  Education details: Patient was educated on diagnosis, anatomy and pathology involved, prognosis, role of PT, and was given an HEP, demonstrating exercise with proper form following verbal and tactile cues, and was given a paper hand out to continue exercise at home. Pt was educated on and agreed to plan of care.  Person educated: Patient Education method: Explanation, Demonstration, and Verbal cues Education comprehension: verbalized understanding, returned demonstration, and verbal cues required  HOME EXERCISE PROGRAM: [id number]  ASSESSMENT:  CLINICAL IMPRESSION:  Pt continues to  perform well with exercises and did note some increased pain as therapist progressed with ROM during contract relax techniques and overpressure applied.  Pt still remains optimistic and is hopeful that MD will be pleased with progress and will not recommend a manipulation.   Pt will continue to benefit from skilled therapy to address remaining deficits in order to improve overall QoL and return to PLOF.      OBJECTIVE IMPAIRMENTS: carrying, lifting, bending, standing, squatting, stairs, transfers, bed mobility, bathing, dressing, hygiene/grooming, and caring for others.   ACTIVITY LIMITATIONS: carrying, lifting, bending, sitting, standing, squatting, sleeping, transfers, bed mobility, bathing, toileting, and locomotion level  PARTICIPATION LIMITATIONS: meal prep, cleaning, laundry, shopping, community activity, and yard work  PERSONAL FACTORS: Age, Fitness, Past/current experiences, Time since onset of injury/illness/exacerbation, and 3+ comorbidities: HLD, HTN, athritis, prostate cancer  are also affecting patient's functional outcome.   REHAB POTENTIAL: Good  CLINICAL DECISION MAKING: Evolving/moderate complexity  EVALUATION COMPLEXITY: Moderate   GOALS: Goals reviewed with patient? Yes  SHORT TERM GOALS: Target date: 10/08/22 Pt will be independent with HEP in order to improve strength and balance in order to decrease fall risk and improve function at home and work.  Baseline: HEP given; 10/11/22 HEP updated Goal status: ongoing   LONG TERM GOALS: Target date: 11/17/22  Pt will demonstrate L knee mobility of 0-120d in order to demonstrate mobility needed for normalized gait Baseline: 22-75; 10/11/22 20-109 Goal status: ONGOING  2.  Patient will increase FOTO score to 61 to demonstrate predicted increase in functional mobility to complete ADLs Baseline: 36 Goal status: INITIAL  3.  Pt will demonstrate 5TSTS by at least 13 seconds in order to demonstrate age matched LE strength  needed for ind transfers Baseline: 40sec Goal status: INITIAL  4.  Pt will increase to at least 1.40m/s in order to demonstrate community ambulation speed with decreased fall risk  Baseline: self selected: 0.28m/s fastest: 0.20m/s Goal status: INITIAL     PLAN:  PT FREQUENCY: 1-2x/week  PT DURATION: 8 weeks  PLANNED INTERVENTIONS: Therapeutic exercises, Therapeutic activity, Neuromuscular re-education, Balance training, Gait training, Patient/Family education, Self Care, Joint mobilization, Joint manipulation, Stair training, Dry Needling, and Electrical stimulation  PLAN FOR NEXT SESSION:   SPC progression and L knee AROM/strengthening; continue to add manual approach to see if progress is made.   Nolon Bussing, PT, DPT Physical Therapist - [facility]  10/25/22, 9:23 AM

## 2022-10-26 ENCOUNTER — Encounter: Payer: Medicare PPO | Admitting: Physical Therapy

## 2022-10-30 ENCOUNTER — Ambulatory Visit: Payer: Medicare PPO

## 2022-10-30 ENCOUNTER — Encounter: Payer: Self-pay | Admitting: Orthopaedic Surgery

## 2022-10-30 ENCOUNTER — Ambulatory Visit (INDEPENDENT_AMBULATORY_CARE_PROVIDER_SITE_OTHER): Payer: Medicare PPO | Admitting: Orthopaedic Surgery

## 2022-10-30 ENCOUNTER — Encounter: Payer: Medicare PPO | Admitting: Physical Therapy

## 2022-10-30 DIAGNOSIS — M25562 Pain in left knee: Secondary | ICD-10-CM

## 2022-10-30 DIAGNOSIS — Z96652 Presence of left artificial knee joint: Secondary | ICD-10-CM

## 2022-10-30 NOTE — Therapy (Signed)
OUTPATIENT PHYSICAL THERAPY LOWER EXTREMITY TREATMENT   Patient Name: Robert [name] MRN: [id number] DOB:[birth year], 82 y.o., male Today's Date: 10/30/2022   END OF SESSION:  PT End of Session - 10/30/22 0916     Visit Number 15    Number of Visits 17    Date for PT Re-Evaluation 11/24/22    Authorization Type [employer]    Authorization Time Period 20 visits 09/15/22 - 11/24/22    Authorization - Visit Number 15    Authorization - Number of Visits 20    Progress Note Due on Visit 20    PT Start Time 0917    PT Stop Time 1000    PT Time Calculation (min) 43 min    Equipment Utilized During Treatment Gait belt    Activity Tolerance Patient tolerated treatment well    Behavior During Therapy WFL for tasks assessed/performed              Past Medical History:  Diagnosis Date   Arthritis    Diverticulosis, sigmoid 08/12/2014   Moderate, noted on colonoscopy   History of adenomatous polyp of colon    Hyperlipidemia    Hypertension    followed by pcp   (03-16-2020  per pt had stress test several years ago, told ok)   Left inguinal hernia    Prostate cancer urologist-- dr Mena Goes   dx 2019,  Stage T1c, Gleason 3+4;  s/p  brachytherapy 03-08-2018   Wears glasses    Past Surgical History:  Procedure Laterality Date   COLONOSCOPY W/ POLYPECTOMY  last one 12-18-2019  dr Marina Goodell   CYSTOSCOPY N/A 03/08/2018   Procedure: CYSTOSCOPY;  Surgeon: Jerilee Field, MD;  Location: [facility];  Service: Urology;  Laterality: N/A;  no seeds in bladder per dr eskridge   INGUINAL HERNIA REPAIR Left 03/19/2020   Procedure: LEFT INGUINAL HERNIA REPAIR;  Surgeon: Sheliah Hatch, De Blanch, MD;  Location: [facility];  Service: General;  Laterality: Left;   INSERTION OF MESH Left 03/19/2020   Procedure: INSERTION OF MESH;  Surgeon: Kinsinger, De Blanch, MD;  Location: [facility];  Service: General;  Laterality: Left;   PROSTATE BIOPSY  10/16/2017    RADIOACTIVE SEED IMPLANT N/A 03/08/2018   Procedure: RADIOACTIVE SEED IMPLANT/BRACHYTHERAPY IMPLANT;  Surgeon: Jerilee Field, MD;  Location: [facility];  Service: Urology;  Laterality: N/A;  86 seeds   SPACE OAR INSTILLATION N/A 03/08/2018   Procedure: SPACE OAR INSTILLATION;  Surgeon: Jerilee Field, MD;  Location: [facility];  Service: Urology;  Laterality: N/A;   TONSILLECTOMY  child   TOTAL KNEE ARTHROPLASTY Left 08/25/2022   Procedure: LEFT TOTAL KNEE ARTHROPLASTY;  Surgeon: Kathryne Hitch, MD;  Location: [facility];  Service: Orthopedics;  Laterality: Left;   Patient Active Problem List   Diagnosis Date Noted   Status post total left knee replacement 08/25/2022   Unilateral primary osteoarthritis, left knee 06/14/2022   Malignant neoplasm of prostate 12/10/2017    PCP: Eloise Harman MD  REFERRING PROVIDER: Rosey Bath MD  REFERRING DIAG: L TKA  THERAPY DIAG:  Acute pain of left knee  Rationale for Evaluation and Treatment: Rehabilitation  ONSET DATE: 08/25/22  SUBJECTIVE:   SUBJECTIVE STATEMENT:  Pt reports he had a good weekend, watching the race yesterday.  Pt states he has been walking more recently.      PERTINENT HISTORY:  Pt is an 82 year old male presenting s/p L TKA 08/25/22. PLOF ambulation without AD, complete  ind with all ADLs. Currently ambulating with RW in the home and community. Lives at home with wife, with family in the area. His daughter is staying with him to make meals, clean, drive, etc. Reports he is dressing himself though he does need assistance from daughter to don/doff ted hose. Home with ramped entry, single level, walk in shower with seat and grab bars, and standard height toilet seat. Current pain level 5/10 best: /10 worst 8/10. Pt has had HHPT since surgery, and is doing mini squats, and walking down the hall at home. Is taking oxycodone as needed for pain. No falls in past 6 months. Pt denies N/V, B&B changes,  unexplained weight fluctuation, saddle paresthesia, fever, night sweats, or unrelenting night pain at this time.   PAIN:  Are you having pain? Yes: NPRS scale: 5/10 Pain location: L knee Pain description: aching Aggravating factors: standing, walking Relieving factors: rest  PRECAUTIONS: None  WEIGHT BEARING RESTRICTIONS: No  FALLS:  Has patient fallen in last 6 months? No  LIVING ENVIRONMENT: Lives with: lives with their family Lives in: House/apartment Stairs: No Has following equipment at home: Single point cane, Environmental consultant - 2 wheeled, Tour manager, Grab bars, and Ramped entry  OCCUPATION: Retired  PLOF: Independent with basic ADLs  PATIENT GOALS: Be able to walk without RW  NEXT MD VISIT: 10/30/22  OBJECTIVE:   DIAGNOSTIC FINDINGS:   PATIENT SURVEYS:  FOTO 36 goal 68  COGNITION: Overall cognitive status: Within functional limits for tasks assessed     SENSATION: WFL  EDEMA:  Circumferential: L mid patella 46cm; R mid patell 41cm  MUSCLE LENGTH: Hamstrings: shortened bilat Thomas test: shortened RLE; unable to test on L  POSTURE: rounded shoulders, forward head, flexed trunk , and weight shift right  PALPATION: Swelling inhibiting patellar movement; ttp diffuse of L knee  LOWER EXTREMITY ROM:  Active ROM Right eval Left eval  Hip flexion 110/110 90/110  Hip extension ~5/10 bilat  Hip abduction    Hip adduction    Hip internal rotation 50% limited bilat  Hip external rotation    Knee flexion 128 75/78  Knee extension 0 25/22  Ankle dorsiflexion 15 14  Ankle plantarflexion    Ankle inversion    Ankle eversion     (Blank rows = not tested)  LOWER EXTREMITY MMT:  MMT Right eval Left eval  Hip flexion 4+ 2+  Hip extension 3- 2+  Hip abduction 4- 3+  Hip adduction    Hip internal rotation 5 4+  Hip external rotation 4+ 4  Knee flexion 4+ 3+  Knee extension 4+ 3  Ankle dorsiflexion 4+ 4+  Ankle plantarflexion    Ankle inversion     Ankle eversion     (Blank rows = not tested)   FUNCTIONAL TESTS:  5 times sit to stand: 40sec with UE use for push off 10 meter walk test: self selected: 0.55m/s fastest: 0.40m/s SLS unable bilat  GAIT: Distance walked: 60M Assistive device utilized: Environmental consultant - 2 wheeled Level of assistance: Modified independence Comments: decreased L stance time; decreased RLE step length, heavy reliance on RW with RLE swing    TODAY'S TREATMENT: DATE: 10/30/22  TherEx:  Recumbent bike AAROM knee flex/ext seat 11, pt performed pedaling forward/backward, 8 mins  Supine heel slide 2x15 Supine SAQ with blue bolster under knee, 2x15 2 sec holds Heel prop stretch with bolster roll under heel 7.5# AW at distal femur x60seconds.  Femur G3-4 AP mob in above position 2x 30sec  bouts TRX squat with CGA for safety, x12 attempts Standing terminal knee extension into physioball at wall, 2x10 Seated leg press, BLE, 100#, 2x10  Ambulation around the gym without AD and focus on heel strike and more normalized gait pattern throughout. X5 laps total in gym  L Knee Flexion AROM: 108 deg L Knee Extension AROM: 15 deg lacking    PATIENT EDUCATION:  Education details: Patient was educated on diagnosis, anatomy and pathology involved, prognosis, role of PT, and was given an HEP, demonstrating exercise with proper form following verbal and tactile cues, and was given a paper hand out to continue exercise at home. Pt was educated on and agreed to plan of care.  Person educated: Patient Education method: Explanation, Demonstration, and Verbal cues Education comprehension: verbalized understanding, returned demonstration, and verbal cues required  HOME EXERCISE PROGRAM: [id number]  ASSESSMENT:  CLINICAL IMPRESSION:  Pt demonstrated reduced ROM during treatment session today, however has still made progress since beginning.  Pt with tissue restrictions due to soft end feel and ability for pt to gain more ROM  with overpressure applied by therapist.  Pt to be evaluated by MD at later time today and will see if MD recommends any other intervention due to progress being limited at this time.   Pt will continue to benefit from skilled therapy to address remaining deficits in order to improve overall QoL and return to PLOF.        OBJECTIVE IMPAIRMENTS: carrying, lifting, bending, standing, squatting, stairs, transfers, bed mobility, bathing, dressing, hygiene/grooming, and caring for others.   ACTIVITY LIMITATIONS: carrying, lifting, bending, sitting, standing, squatting, sleeping, transfers, bed mobility, bathing, toileting, and locomotion level  PARTICIPATION LIMITATIONS: meal prep, cleaning, laundry, shopping, community activity, and yard work  PERSONAL FACTORS: Age, Fitness, Past/current experiences, Time since onset of injury/illness/exacerbation, and 3+ comorbidities: HLD, HTN, athritis, prostate cancer  are also affecting patient's functional outcome.   REHAB POTENTIAL: Good  CLINICAL DECISION MAKING: Evolving/moderate complexity  EVALUATION COMPLEXITY: Moderate   GOALS: Goals reviewed with patient? Yes  SHORT TERM GOALS: Target date: 10/08/22 Pt will be independent with HEP in order to improve strength and balance in order to decrease fall risk and improve function at home and work.  Baseline: HEP given; 10/11/22 HEP updated Goal status: ongoing   LONG TERM GOALS: Target date: 11/17/22  Pt will demonstrate L knee mobility of 0-120d in order to demonstrate mobility needed for normalized gait Baseline: 22-75; 10/11/22 20-109 Goal status: ONGOING  2.  Patient will increase FOTO score to 61 to demonstrate predicted increase in functional mobility to complete ADLs Baseline: 36 Goal status: INITIAL  3.  Pt will demonstrate 5TSTS by at least 13 seconds in order to demonstrate age matched LE strength needed for ind transfers Baseline: 40sec Goal status: INITIAL  4.  Pt will increase  to at least 1.54m/s in order to demonstrate community ambulation speed with decreased fall risk  Baseline: self selected: 0.55m/s fastest: 0.62m/s Goal status: INITIAL     PLAN:  PT FREQUENCY: 1-2x/week  PT DURATION: 8 weeks  PLANNED INTERVENTIONS: Therapeutic exercises, Therapeutic activity, Neuromuscular re-education, Balance training, Gait training, Patient/Family education, Self Care, Joint mobilization, Joint manipulation, Stair training, Dry Needling, and Electrical stimulation  PLAN FOR NEXT SESSION:  Assess plan from MD recommendations.  Continue strengthening exercises as necessary.   Nolon Bussing, PT, DPT Physical Therapist - [facility]  10/30/22, 9:17 AM

## 2022-10-30 NOTE — Progress Notes (Signed)
[name] is now just past 9 weeks status post a left knee replacement.  He has finally turned the corner from a therapy standpoint in terms of getting better motion for his left knee.  He is ambulate with a cane.  He has driven.  He is 82 years old.  Family is with him today as well.  On my exam there is still some swelling of his knee which will take months for it to subside.  His extension still is not full and I can flex in the past 100 degrees today.  He would definitely benefit from continued outpatient physical therapy over the next 4 to 6 weeks.  This would maximize his motion and mobility.  From my standpoint, we will see him back in 3 months.  He will still push himself daily at home.  At his visit in 3 months we will have a standing AP and lateral of his left operative knee.

## 2022-11-01 ENCOUNTER — Ambulatory Visit: Payer: Medicare PPO

## 2022-11-01 DIAGNOSIS — M25562 Pain in left knee: Secondary | ICD-10-CM

## 2022-11-01 NOTE — Therapy (Addendum)
OUTPATIENT PHYSICAL THERAPY LOWER EXTREMITY TREATMENT   Patient Name: Robert [name] MRN: [id number] DOB:January 17, 1941, 82 y.o., male Today's Date: 11/01/2022   END OF SESSION:  PT End of Session - 11/01/22 0908     Visit Number 16    Number of Visits 17    Date for PT Re-Evaluation 11/24/22    Authorization Type [employer]    Authorization Time Period 20 visits 09/15/22 - 11/24/22    Authorization - Number of Visits 20    Progress Note Due on Visit 20    PT Start Time 0915    PT Stop Time 0959    PT Time Calculation (min) 44 min    Equipment Utilized During Treatment Gait belt    Activity Tolerance Patient tolerated treatment well    Behavior During Therapy WFL for tasks assessed/performed              Past Medical History:  Diagnosis Date   Arthritis    Diverticulosis, sigmoid 08/12/2014   Moderate, noted on colonoscopy   History of adenomatous polyp of colon    Hyperlipidemia    Hypertension    followed by pcp   (03-16-2020  per pt had stress test several years ago, told ok)   Left inguinal hernia    Prostate cancer urologist-- dr Mena Goes   dx 2019,  Stage T1c, Gleason 3+4;  s/p  brachytherapy 03-08-2018   Wears glasses    Past Surgical History:  Procedure Laterality Date   COLONOSCOPY W/ POLYPECTOMY  last one 12-18-2019  dr Marina Goodell   CYSTOSCOPY N/A 03/08/2018   Procedure: CYSTOSCOPY;  Surgeon: Jerilee Field, MD;  Location: [facility];  Service: Urology;  Laterality: N/A;  no seeds in bladder per dr eskridge   INGUINAL HERNIA REPAIR Left 03/19/2020   Procedure: LEFT INGUINAL HERNIA REPAIR;  Surgeon: Sheliah Hatch, De Blanch, MD;  Location: [facility];  Service: General;  Laterality: Left;   INSERTION OF MESH Left 03/19/2020   Procedure: INSERTION OF MESH;  Surgeon: Kinsinger, De Blanch, MD;  Location: [facility];  Service: General;  Laterality: Left;   PROSTATE BIOPSY  10/16/2017   RADIOACTIVE SEED IMPLANT N/A 03/08/2018    Procedure: RADIOACTIVE SEED IMPLANT/BRACHYTHERAPY IMPLANT;  Surgeon: Jerilee Field, MD;  Location: [facility];  Service: Urology;  Laterality: N/A;  86 seeds   SPACE OAR INSTILLATION N/A 03/08/2018   Procedure: SPACE OAR INSTILLATION;  Surgeon: Jerilee Field, MD;  Location: [facility];  Service: Urology;  Laterality: N/A;   TONSILLECTOMY  child   TOTAL KNEE ARTHROPLASTY Left 08/25/2022   Procedure: LEFT TOTAL KNEE ARTHROPLASTY;  Surgeon: Kathryne Hitch, MD;  Location: [facility];  Service: Orthopedics;  Laterality: Left;   Patient Active Problem List   Diagnosis Date Noted   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   PERTINENT HISTORY:  Pt is an 82 year old male presenting s/p L TKA 08/25/22. PLOF ambulation without AD, complete ind with all ADLs. Currently ambulating with RW in the home and community. Lives at home with wife, with family in the area. His  daughter is staying with him to make meals, clean, drive, etc. Reports he is dressing himself though he does need assistance from daughter to don/doff ted hose. Home with ramped entry, single level, walk in shower with seat and grab bars, and standard height toilet seat. Current pain level 5/10 best: /10 worst 8/10. Pt has had HHPT since surgery, and is doing mini squats, and walking down the hall at home. Is taking oxycodone as needed for pain. No falls in past 6 months. Pt denies N/V, B&B changes, unexplained weight fluctuation, saddle paresthesia, fever, night sweats, or unrelenting night pain at this time.   PAIN:  Are you having  pain? Yes: NPRS scale: 5/10 Pain location: L knee Pain description: aching Aggravating factors: standing, walking Relieving factors: rest  PRECAUTIONS: None  WEIGHT BEARING RESTRICTIONS: No  FALLS:  Has patient fallen in last 6 months? No  LIVING ENVIRONMENT: Lives with: lives with their family Lives in: House/apartment Stairs: No Has following equipment at home: Single point cane, Environmental consultant - 2 wheeled, Tour manager, Grab bars, and Ramped entry  OCCUPATION: Retired  PLOF: Independent with basic ADLs  PATIENT GOALS: Be able to walk without RW  NEXT MD VISIT: 10/30/22  OBJECTIVE:   DIAGNOSTIC FINDINGS:   PATIENT SURVEYS:  FOTO 36 goal 60  COGNITION: Overall cognitive status: Within functional limits for tasks assessed     SENSATION: WFL  EDEMA:  Circumferential: L mid patella 46cm; R mid patell 41cm  MUSCLE LENGTH: Hamstrings: shortened bilat Thomas test: shortened RLE; unable to test on L  POSTURE: rounded shoulders, forward head, flexed trunk , and weight shift right  PALPATION: Swelling inhibiting patellar movement; ttp diffuse of L knee  LOWER EXTREMITY ROM:  Active ROM Right eval Left eval  Hip flexion 110/110 90/110  Hip extension ~5/10 bilat  Hip abduction    Hip adduction    Hip internal rotation 50% limited bilat  Hip external rotation    Knee flexion 128 75/78  Knee extension 0 25/22  Ankle dorsiflexion 15 14  Ankle plantarflexion    Ankle inversion    Ankle eversion     (Blank rows = not tested)  LOWER EXTREMITY MMT:  MMT Right eval Left eval  Hip flexion 4+ 2+  Hip extension 3- 2+  Hip abduction 4- 3+  Hip adduction    Hip internal rotation 5 4+  Hip external rotation 4+ 4  Knee flexion 4+ 3+  Knee extension 4+ 3  Ankle dorsiflexion 4+ 4+  Ankle plantarflexion    Ankle inversion    Ankle eversion     (Blank rows = not tested)   FUNCTIONAL TESTS:  5 times sit to stand: 40sec with UE use for push off 10 meter walk test:  self selected: 0.41m/s fastest: 0.80m/s SLS unable bilat  GAIT: Distance walked: 52M Assistive device utilized: Environmental consultant - 2 wheeled Level of assistance: Modified independence Comments: decreased L stance time; decreased RLE step length, heavy reliance on RW with RLE swing    TODAY'S TREATMENT: DATE: 11/01/22  TherEx:  Recumbent bike AAROM knee flex/ext seat 11, pt performed pedaling forward/backward, 8 mins Supine heel slide 2x10 Heel prop stretch   Femur G3-4 AP mob in above position 5 x 15 sec bouts, shorter bouts due to pain.  Supine SAQ with blue bolster under knee, 2x15 2 sec holds Supine SLR x 15 TRX squat with CGA for safety, x10  L Knee Flexion AROM: 110 deg L Knee Extension AROM: 14 deg lacking  Trigger Point Dry Needling (TDN), unbilled Education performed with patient regarding potential benefit of TDN. Reviewed precautions and risks with patient. Reviewed special precautions/risks over lung fields which include pneumothorax. Reviewed signs and symptoms of pneumothorax and advised pt to go to ER immediately if these symptoms develop advise them of dry needling treatment. Extensive time spent with pt to ensure full understanding of TDN risks. Pt provided verbal consent to treatment. TDN performed to bilateral hamstrings (medial and lateral) with 0.3 x 60 single needle placements with local twitch response (LTR). Pistoning technique utilized. Improved pain-free motion following intervention.   Dry needling performed by:  Nolon Bussing, PT, DPT Physical Therapist - [place]  [facility]     PATIENT EDUCATION:  Education details: Patient was educated on diagnosis, anatomy and pathology involved, prognosis, role of PT, and was given an HEP, demonstrating exercise with proper form following verbal and tactile cues, and was given a paper hand out to continue exercise at home. Pt was educated on and agreed to plan of care.  Person educated:  Patient Education method: Explanation, Demonstration, and Verbal cues Education comprehension: verbalized understanding, returned demonstration, and verbal cues required  HOME EXERCISE PROGRAM: [id number]  ASSESSMENT:  CLINICAL IMPRESSION: Patient responded well to treatment interventions this session. Patient continues to have limited ROM 2/2 tissue restrictions due to soft end feel and ability to gain more ROM with overpressure provided by therapist. Continues to make slow progress towards physical therapy goals. Patient will continue to benefit from skilled therapy to address remaining deficits in order to improve quality of life and return to PLOF.     OBJECTIVE IMPAIRMENTS: carrying, lifting, bending, standing, squatting, stairs, transfers, bed mobility, bathing, dressing, hygiene/grooming, and caring for others.   ACTIVITY LIMITATIONS: carrying, lifting, bending, sitting, standing, squatting, sleeping, transfers, bed mobility, bathing, toileting, and locomotion level  PARTICIPATION LIMITATIONS: meal prep, cleaning, laundry, shopping, community activity, and yard work  PERSONAL FACTORS: Age, Fitness, Past/current experiences, Time since onset of injury/illness/exacerbation, and 3+ comorbidities: HLD, HTN, athritis, prostate cancer  are also affecting patient's functional outcome.   REHAB POTENTIAL: Good  CLINICAL DECISION MAKING: Evolving/moderate complexity  EVALUATION COMPLEXITY: Moderate   GOALS: Goals reviewed with patient? Yes  SHORT TERM GOALS: Target date: 10/08/22 Pt will be independent with HEP in order to improve strength and balance in order to decrease fall risk and improve function at home and work.  Baseline: HEP given; 10/11/22 HEP updated Goal status: ongoing   LONG TERM GOALS: Target date: 11/17/22  Pt will demonstrate L knee mobility of 0-120d in order to demonstrate mobility needed for normalized gait Baseline: 22-75; 10/11/22 20-109 Goal status:  ONGOING  2.  Patient will increase FOTO score to 61 to demonstrate predicted increase in functional mobility to complete ADLs Baseline: 36 Goal status: INITIAL  3.  Pt will demonstrate 5TSTS by at least 13 seconds in order to demonstrate age matched LE strength needed for ind transfers Baseline: 40sec Goal status: INITIAL  4.  Pt will increase to at least 1.19m/s in order to demonstrate community ambulation speed with decreased fall risk  Baseline: self selected: 0.62m/s fastest: 0.75m/s Goal status: INITIAL     PLAN:  PT FREQUENCY: 1-2x/week  PT DURATION: 8 weeks  PLANNED INTERVENTIONS: Therapeutic exercises, Therapeutic activity, Neuromuscular re-education, Balance training, Gait training, Patient/Family education, Self Care, Joint mobilization, Joint manipulation, Stair training, Dry Needling, and Electrical stimulation  PLAN FOR NEXT SESSION: Continue strengthening exercises as necessary.   Maylon Peppers,  PT, DPT Physical Therapist - [place]  [facility] 11/01/22, 9:09 AM

## 2022-11-02 ENCOUNTER — Encounter: Payer: Medicare PPO | Admitting: Physical Therapy

## 2022-11-06 ENCOUNTER — Ambulatory Visit: Payer: Medicare PPO

## 2022-11-06 DIAGNOSIS — M25562 Pain in left knee: Secondary | ICD-10-CM

## 2022-11-06 NOTE — Therapy (Signed)
OUTPATIENT PHYSICAL THERAPY LOWER EXTREMITY TREATMENT   Patient Name: Robert [name] MRN: [id number] DOB:[birth year], 82 y.o., male Today's Date: 11/06/2022   END OF SESSION:  PT End of Session - 11/06/22 1250     Visit Number 17    Number of Visits 25    Date for PT Re-Evaluation 12/04/22    Authorization Type [employer]    Authorization Time Period 20 visits 09/15/22 - 11/24/22    Authorization - Visit Number 17    Authorization - Number of Visits 20    Progress Note Due on Visit 20    PT Start Time 0914    PT Stop Time 1000    PT Time Calculation (min) 46 min    Equipment Utilized During Treatment Gait belt    Activity Tolerance Patient tolerated treatment well    Behavior During Therapy WFL for tasks assessed/performed               Past Medical History:  Diagnosis Date   Arthritis    Diverticulosis, sigmoid 08/12/2014   Moderate, noted on colonoscopy   History of adenomatous polyp of colon    Hyperlipidemia    Hypertension    followed by pcp   (03-16-2020  per pt had stress test several years ago, told ok)   Left inguinal hernia    Prostate cancer [facility]) urologist-- dr Mena Goes   dx 2019,  Stage T1c, Gleason 3+4;  s/p  brachytherapy 03-08-2018   Wears glasses    Past Surgical History:  Procedure Laterality Date   COLONOSCOPY W/ POLYPECTOMY  last one 12-18-2019  dr Marina Goodell   CYSTOSCOPY N/A 03/08/2018   Procedure: CYSTOSCOPY;  Surgeon: Jerilee Field, MD;  Location: [facility];  Service: Urology;  Laterality: N/A;  no seeds in bladder per dr eskridge   INGUINAL HERNIA REPAIR Left 03/19/2020   Procedure: LEFT INGUINAL HERNIA REPAIR;  Surgeon: Sheliah Hatch, De Blanch, MD;  Location: [facility];  Service: General;  Laterality: Left;   INSERTION OF MESH Left 03/19/2020   Procedure: INSERTION OF MESH;  Surgeon: Kinsinger, De Blanch, MD;  Location: [facility];  Service: General;  Laterality: Left;   PROSTATE BIOPSY  10/16/2017    RADIOACTIVE SEED IMPLANT N/A 03/08/2018   Procedure: RADIOACTIVE SEED IMPLANT/BRACHYTHERAPY IMPLANT;  Surgeon: Jerilee Field, MD;  Location: [facility];  Service: Urology;  Laterality: N/A;  86 seeds   SPACE OAR INSTILLATION N/A 03/08/2018   Procedure: SPACE OAR INSTILLATION;  Surgeon: Jerilee Field, MD;  Location: [facility];  Service: Urology;  Laterality: N/A;   TONSILLECTOMY  child   TOTAL KNEE ARTHROPLASTY Left 08/25/2022   Procedure: LEFT TOTAL KNEE ARTHROPLASTY;  Surgeon: Kathryne Hitch, MD;  Location: [facility];  Service: Orthopedics;  Laterality: Left;   Patient Active Problem List   Diagnosis Date Noted   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  Pt notes he did not do anything special over the weekend.  PERTINENT HISTORY:  Pt is an 82 year old male presenting s/p L TKA 08/25/22. PLOF ambulation without  AD, complete ind with all ADLs. Currently ambulating with RW in the home and community. Lives at home with wife, with family in the area. His daughter is staying with him to make meals, clean, drive, etc. Reports he is dressing himself though he does need assistance from daughter to don/doff ted hose. Home with ramped entry, single level, walk in shower with seat and grab bars, and standard height toilet seat. Current pain level 5/10 best: /10 worst 8/10. Pt has had HHPT since surgery, and is doing mini squats, and walking down the hall at home. Is taking oxycodone as needed for pain. No falls in past 6 months. Pt denies N/V, B&B changes,  unexplained weight fluctuation, saddle paresthesia, fever, night sweats, or unrelenting night pain at this time.   PAIN:  Are you having pain? Yes: NPRS scale: 5/10 Pain location: L knee Pain description: aching Aggravating factors: standing, walking Relieving factors: rest  PRECAUTIONS: None  WEIGHT BEARING RESTRICTIONS: No  FALLS:  Has patient fallen in last 6 months? No  LIVING ENVIRONMENT: Lives with: lives with their family Lives in: House/apartment Stairs: No Has following equipment at home: Single point cane, Environmental consultant - 2 wheeled, Tour manager, Grab bars, and Ramped entry  OCCUPATION: Retired  PLOF: Independent with basic ADLs  PATIENT GOALS: Be able to walk without RW  NEXT MD VISIT: 10/30/22  OBJECTIVE:   DIAGNOSTIC FINDINGS:   PATIENT SURVEYS:  FOTO 36 goal 19  COGNITION: Overall cognitive status: Within functional limits for tasks assessed     SENSATION: WFL  EDEMA:  Circumferential: L mid patella 46cm; R mid patell 41cm  MUSCLE LENGTH: Hamstrings: shortened bilat Thomas test: shortened RLE; unable to test on L  POSTURE: rounded shoulders, forward head, flexed trunk , and weight shift right  PALPATION: Swelling inhibiting patellar movement; ttp diffuse of L knee  LOWER EXTREMITY ROM:  Active ROM Right eval Left eval  Hip flexion 110/110 90/110  Hip extension ~5/10 bilat  Hip abduction    Hip adduction    Hip internal rotation 50% limited bilat  Hip external rotation    Knee flexion 128 75/78  Knee extension 0 25/22  Ankle dorsiflexion 15 14  Ankle plantarflexion    Ankle inversion    Ankle eversion     (Blank rows = not tested)  LOWER EXTREMITY MMT:  MMT Right eval Left eval  Hip flexion 4+ 2+  Hip extension 3- 2+  Hip abduction 4- 3+  Hip adduction    Hip internal rotation 5 4+  Hip external rotation 4+ 4  Knee flexion 4+ 3+  Knee extension 4+ 3  Ankle dorsiflexion 4+ 4+  Ankle plantarflexion    Ankle inversion     Ankle eversion     (Blank rows = not tested)   FUNCTIONAL TESTS:  5 times sit to stand: 40sec with UE use for push off 10 meter walk test: self selected: 0.70m/s fastest: 0.50m/s SLS unable bilat  GAIT: Distance walked: 70M Assistive device utilized: Environmental consultant - 2 wheeled Level of assistance: Modified independence Comments: decreased L stance time; decreased RLE step length, heavy reliance on RW with RLE swing    TODAY'S TREATMENT: DATE: 11/06/22  TherEx:  Recumbent bike AAROM knee flex/ext seat 11, pt performed pedaling forward/backward, 8 mins Supine heel slide 2x10 Heel prop stretch   Femur G3-4 AP mob in above position 5 x 15 sec bouts, shorter bouts due to pain.  Supine SAQ with brown bolster under knee, 4# Aw on each  LE 2x15 2 sec holds Supine SLR x 15 TRX squat with CGA for safety, x10  Goal assessment performed and noted below:  L Knee Flexion AROM: 109 deg L Knee Extension AROM: 18 deg lacking   Trigger Point Dry Needling (TDN), unbilled, 4 min Education performed with patient regarding potential benefit of TDN. Reviewed precautions and risks with patient. Reviewed special precautions/risks over lung fields which include pneumothorax. Reviewed signs and symptoms of pneumothorax and advised pt to go to ER immediately if these symptoms develop advise them of dry needling treatment. Extensive time spent with pt to ensure full understanding of TDN risks. Pt provided verbal consent to treatment. TDN performed to bilateral hamstrings (medial and lateral) with 0.3 x 60 single needle placements with local twitch response (LTR). Pistoning technique utilized. Improved pain-free motion following intervention.       PATIENT EDUCATION:  Education details: Patient was educated on diagnosis, anatomy and pathology involved, prognosis, role of PT, and was given an HEP, demonstrating exercise with proper form following verbal and tactile cues, and was given a paper hand out to  continue exercise at home. Pt was educated on and agreed to plan of care.  Person educated: Patient Education method: Explanation, Demonstration, and Verbal cues Education comprehension: verbalized understanding, returned demonstration, and verbal cues required  HOME EXERCISE PROGRAM: [id number]  ASSESSMENT:  CLINICAL IMPRESSION:  Pt performed well with the exercises list above, however pt demonstrated decreased ROM upon arrival today.  Pt underwent goal assessment as necessary for re-certification processes.  Pt has been demonstrating increased mobility prior to treatment today, so results are varied from the norm.  Patient's condition has the potential to improve in response to therapy. Maximum improvement is yet to be obtained. The anticipated improvement is attainable and reasonable in a generally predictable time.   Pt will continue to benefit from skilled therapy to address remaining deficits in order to improve overall QoL and return to PLOF.     OBJECTIVE IMPAIRMENTS: carrying, lifting, bending, standing, squatting, stairs, transfers, bed mobility, bathing, dressing, hygiene/grooming, and caring for others.   ACTIVITY LIMITATIONS: carrying, lifting, bending, sitting, standing, squatting, sleeping, transfers, bed mobility, bathing, toileting, and locomotion level  PARTICIPATION LIMITATIONS: meal prep, cleaning, laundry, shopping, community activity, and yard work  PERSONAL FACTORS: Age, Fitness, Past/current experiences, Time since onset of injury/illness/exacerbation, and 3+ comorbidities: HLD, HTN, athritis, prostate cancer  are also affecting patient's functional outcome.   REHAB POTENTIAL: Good  CLINICAL DECISION MAKING: Evolving/moderate complexity  EVALUATION COMPLEXITY: Moderate   GOALS: Goals reviewed with patient? Yes  SHORT TERM GOALS: Target date: 10/08/22 Pt will be independent with HEP in order to improve strength and balance in order to decrease fall risk and  improve function at home and work.  Baseline: HEP given; 10/11/22 HEP updated Goal status: ongoing   LONG TERM GOALS: Target date: 11/17/22  Pt will demonstrate L knee mobility of 0-120d in order to demonstrate mobility needed for normalized gait Baseline: 22-75;  10/11/22: 20-109 11/06/22: 18-109 deg Goal status: ONGOING  2.  Patient will increase FOTO score to 61 to demonstrate predicted increase in functional mobility to complete ADLs Baseline: 36 11/06/22: not assessed Goal status: INITIAL  3.  Pt will demonstrate 5TSTS by at least 13 seconds in order to demonstrate age matched LE strength needed for ind transfers Baseline: 40 sec 11/06/22: not assessed Goal status: INITIAL  4.  Pt will increase to at least 1.75m/s in order to demonstrate community ambulation speed with decreased  fall risk  Baseline: self selected: 0.65m/s fastest: 0.9m/s 11/06/22: not assessed Goal status: INITIAL     PLAN:  PT FREQUENCY: 1-2x/week  PT DURATION: 8 weeks  PLANNED INTERVENTIONS: Therapeutic exercises, Therapeutic activity, Neuromuscular re-education, Balance training, Gait training, Patient/Family education, Self Care, Joint mobilization, Joint manipulation, Stair training, Dry Needling, and Electrical stimulation  PLAN FOR NEXT SESSION:  assess remaining goals at future session.    Nolon Bussing, PT, DPT Physical Therapist - [facility]  11/06/22, 12:54 PM

## 2022-11-07 ENCOUNTER — Encounter: Payer: Medicare PPO | Admitting: Physical Therapy

## 2022-11-08 ENCOUNTER — Ambulatory Visit: Payer: Medicare PPO | Attending: Orthopaedic Surgery

## 2022-11-08 DIAGNOSIS — M25562 Pain in left knee: Secondary | ICD-10-CM | POA: Diagnosis not present

## 2022-11-08 NOTE — Therapy (Addendum)
OUTPATIENT PHYSICAL THERAPY LOWER EXTREMITY TREATMENT   Patient Name: Robert [name] MRN: [id number] DOB:13-Dec-1940, 82 y.o., male Today's Date: 11/08/2022   END OF SESSION:  PT End of Session - 11/08/22 1045     Visit Number 18    Number of Visits 25    Date for PT Re-Evaluation 12/04/22    Authorization Type [employer]    Authorization Time Period 20 visits 09/15/22 - 11/24/22    Authorization - Visit Number 18    Authorization - Number of Visits 20    Progress Note Due on Visit 20    PT Start Time 1045    PT Stop Time 1130    PT Time Calculation (min) 45 min    Equipment Utilized During Treatment Gait belt    Activity Tolerance Patient tolerated treatment well    Behavior During Therapy WFL for tasks assessed/performed               Past Medical History:  Diagnosis Date   Arthritis    Diverticulosis, sigmoid 08/12/2014   Moderate, noted on colonoscopy   History of adenomatous polyp of colon    Hyperlipidemia    Hypertension    followed by pcp   (03-16-2020  per pt had stress test several years ago, told ok)   Left inguinal hernia    Prostate cancer [facility]) urologist-- dr Mena Goes   dx 2019,  Stage T1c, Gleason 3+4;  s/p  brachytherapy 03-08-2018   Wears glasses    Past Surgical History:  Procedure Laterality Date   COLONOSCOPY W/ POLYPECTOMY  last one 12-18-2019  dr Marina Goodell   CYSTOSCOPY N/A 03/08/2018   Procedure: CYSTOSCOPY;  Surgeon: Jerilee Field, MD;  Location: [facility];  Service: Urology;  Laterality: N/A;  no seeds in bladder per dr eskridge   INGUINAL HERNIA REPAIR Left 03/19/2020   Procedure: LEFT INGUINAL HERNIA REPAIR;  Surgeon: Sheliah Hatch, De Blanch, MD;  Location: [facility];  Service: General;  Laterality: Left;   INSERTION OF MESH Left 03/19/2020   Procedure: INSERTION OF MESH;  Surgeon: Kinsinger, De Blanch, MD;  Location: [facility];  Service: General;  Laterality: Left;   PROSTATE BIOPSY  10/16/2017    RADIOACTIVE SEED IMPLANT N/A 03/08/2018   Procedure: RADIOACTIVE SEED IMPLANT/BRACHYTHERAPY IMPLANT;  Surgeon: Jerilee Field, MD;  Location: [facility];  Service: Urology;  Laterality: N/A;  86 seeds   SPACE OAR INSTILLATION N/A 03/08/2018   Procedure: SPACE OAR INSTILLATION;  Surgeon: Jerilee Field, MD;  Location: [facility];  Service: Urology;  Laterality: N/A;   TONSILLECTOMY  child   TOTAL KNEE ARTHROPLASTY Left 08/25/2022   Procedure: LEFT TOTAL KNEE ARTHROPLASTY;  Surgeon: Kathryne Hitch, MD;  Location: [facility];  Service: Orthopedics;  Laterality: Left;   Patient Active Problem List   Diagnosis Date Noted   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  Pt notes he is continuing to walk, but has not endorsed doing his HEP.  PERTINENT HISTORY:  Pt is an 82 year old male presenting s/p L TKA 08/25/22. PLOF ambulation without AD, complete ind  with all ADLs. Currently ambulating with RW in the home and community. Lives at home with wife, with family in the area. His daughter is staying with him to make meals, clean, drive, etc. Reports he is dressing himself though he does need assistance from daughter to don/doff ted hose. Home with ramped entry, single level, walk in shower with seat and grab bars, and standard height toilet seat. Current pain level 5/10 best: /10 worst 8/10. Pt has had HHPT since surgery, and is doing mini squats, and walking down the hall at home. Is taking oxycodone as needed for pain. No falls in past 6 months. Pt denies N/V, B&B changes, unexplained weight  fluctuation, saddle paresthesia, fever, night sweats, or unrelenting night pain at this time.   PAIN:  Are you having pain? Yes: NPRS scale: 5/10 Pain location: L knee Pain description: aching Aggravating factors: standing, walking Relieving factors: rest  PRECAUTIONS: None  WEIGHT BEARING RESTRICTIONS: No  FALLS:  Has patient fallen in last 6 months? No  LIVING ENVIRONMENT: Lives with: lives with their family Lives in: House/apartment Stairs: No Has following equipment at home: Single point cane, Environmental consultant - 2 wheeled, Tour manager, Grab bars, and Ramped entry  OCCUPATION: Retired  PLOF: Independent with basic ADLs  PATIENT GOALS: Be able to walk without RW  NEXT MD VISIT: 10/30/22  OBJECTIVE:   DIAGNOSTIC FINDINGS:   PATIENT SURVEYS:  FOTO 36 goal 24  COGNITION: Overall cognitive status: Within functional limits for tasks assessed     SENSATION: WFL  EDEMA:  Circumferential: L mid patella 46cm; R mid patell 41cm  MUSCLE LENGTH: Hamstrings: shortened bilat Thomas test: shortened RLE; unable to test on L  POSTURE: rounded shoulders, forward head, flexed trunk , and weight shift right  PALPATION: Swelling inhibiting patellar movement; ttp diffuse of L knee  LOWER EXTREMITY ROM:  Active ROM Right eval Left eval  Hip flexion 110/110 90/110  Hip extension ~5/10 bilat  Hip abduction    Hip adduction    Hip internal rotation 50% limited bilat  Hip external rotation    Knee flexion 128 75/78  Knee extension 0 25/22  Ankle dorsiflexion 15 14  Ankle plantarflexion    Ankle inversion    Ankle eversion     (Blank rows = not tested)  LOWER EXTREMITY MMT:  MMT Right eval Left eval  Hip flexion 4+ 2+  Hip extension 3- 2+  Hip abduction 4- 3+  Hip adduction    Hip internal rotation 5 4+  Hip external rotation 4+ 4  Knee flexion 4+ 3+  Knee extension 4+ 3  Ankle dorsiflexion 4+ 4+  Ankle plantarflexion    Ankle inversion    Ankle eversion      (Blank rows = not tested)   FUNCTIONAL TESTS:  5 times sit to stand: 40sec with UE use for push off 10 meter walk test: self selected: 0.25m/s fastest: 0.87m/s SLS unable bilat  GAIT: Distance walked: 31M Assistive device utilized: Environmental consultant - 2 wheeled Level of assistance: Modified independence Comments: decreased L stance time; decreased RLE step length, heavy reliance on RW with RLE swing    TODAY'S TREATMENT: DATE: 11/08/22  TherEx:  Recumbent bike AAROM knee flex/ext seat 11, pt performed pedaling forward/backward, 8 mins Supine heel slide 2x10 Heel prop stretch   Femur G3-4 AP mob in above position 5 x 15 sec bouts, shorter bouts due to pain. Ice applied to reduce swelling and improve pain tolerance concurrent with exercises while in supine  position Supine SAQ with brown bolster under knee, 4# Aw on each LE 2x15 2 sec holds Supine SLR x 15 TRX squat with CGA for safety, x15 (given tactile and visual feedback for proper form to improve knee ROM)  Goal assessment performed and noted below:  L Knee Flexion AROM: 109 deg L Knee Extension AROM: 14 deg lacking L Knee Flexion PROM: 109 deg L Knee Extension PROM: 13 deg lacking     PATIENT EDUCATION:  Education details: Patient was educated on diagnosis, anatomy and pathology involved, prognosis, role of PT, and was given an HEP, demonstrating exercise with proper form following verbal and tactile cues, and was given a paper hand out to continue exercise at home. Pt was educated on and agreed to plan of care.  Person educated: Patient Education method: Explanation, Demonstration, and Verbal cues Education comprehension: verbalized understanding, returned demonstration, and verbal cues required  HOME EXERCISE PROGRAM: [id number]  ASSESSMENT:  CLINICAL IMPRESSION:  Pt with much better ROM during treatment session today.  Pt responded well tot he ice therapy applied with concurrent manual therapy.  Pt strongly encouraged to  continue with his HEP and was re-printed in order for pt to have a new copy with updated exercises.  Pt advised to be performing exercises along with walking, as pt is currently just walking.   Pt will continue to benefit from skilled therapy to address remaining deficits in order to improve overall QoL and return to PLOF.      OBJECTIVE IMPAIRMENTS: carrying, lifting, bending, standing, squatting, stairs, transfers, bed mobility, bathing, dressing, hygiene/grooming, and caring for others.   ACTIVITY LIMITATIONS: carrying, lifting, bending, sitting, standing, squatting, sleeping, transfers, bed mobility, bathing, toileting, and locomotion level  PARTICIPATION LIMITATIONS: meal prep, cleaning, laundry, shopping, community activity, and yard work  PERSONAL FACTORS: Age, Fitness, Past/current experiences, Time since onset of injury/illness/exacerbation, and 3+ comorbidities: HLD, HTN, athritis, prostate cancer  are also affecting patient's functional outcome.   REHAB POTENTIAL: Good  CLINICAL DECISION MAKING: Evolving/moderate complexity  EVALUATION COMPLEXITY: Moderate   GOALS: Goals reviewed with patient? Yes  SHORT TERM GOALS: Target date: 10/08/22 Pt will be independent with HEP in order to improve strength and balance in order to decrease fall risk and improve function at home and work.  Baseline: HEP given; 10/11/22 HEP updated Goal status: ongoing   LONG TERM GOALS: Target date: 11/17/22  Pt will demonstrate L knee mobility of 0-120d in order to demonstrate mobility needed for normalized gait Baseline: 22-75;  10/11/22: 20-109 11/06/22: 18-109 deg Goal status: ONGOING  2.  Patient will increase FOTO score to 61 to demonstrate predicted increase in functional mobility to complete ADLs Baseline: 36 11/08/22: 63 Goal status: MET but continue to monitor  3.  Pt will demonstrate 5TSTS by at least 13 seconds in order to demonstrate age matched LE strength needed for ind  transfers Baseline: 40 sec 11/08/22: 14.34 sec Goal status: IN PROGRESS  4.  Pt will increase to at least 1.13m/s in order to demonstrate community ambulation speed with decreased fall risk  Baseline: self selected: 0.35m/s fastest: 0.18m/s 11/08/22: self selected: 0.51m/s; fastest: 0.78m/s Goal status: INITIAL     PLAN:  PT FREQUENCY: 1-2x/week  PT DURATION: 8 weeks  PLANNED INTERVENTIONS: Therapeutic exercises, Therapeutic activity, Neuromuscular re-education, Balance training, Gait training, Patient/Family education, Self Care, Joint mobilization, Joint manipulation, Stair training, Dry Needling, and Electrical stimulation  PLAN FOR NEXT SESSION:  assess remaining goals at future session.    Nolon Bussing,  PT, DPT Physical Therapist - [facility]  11/08/22, 2:17 PM

## 2022-11-09 ENCOUNTER — Encounter: Payer: Medicare PPO | Admitting: Physical Therapy

## 2022-11-13 ENCOUNTER — Ambulatory Visit: Payer: Medicare PPO

## 2022-11-13 DIAGNOSIS — M25562 Pain in left knee: Secondary | ICD-10-CM | POA: Diagnosis not present

## 2022-11-13 NOTE — Therapy (Signed)
OUTPATIENT PHYSICAL THERAPY LOWER EXTREMITY TREATMENT   Patient Name: Robert [name] MRN: [id number] DOB:August 13, 1940, 82 y.o., male Today's Date: 11/13/2022   END OF SESSION:  PT End of Session - 11/13/22 0818     Visit Number 19    Number of Visits 25    Date for PT Re-Evaluation 12/04/22    Authorization Type [employer]    Authorization Time Period 20 visits 09/15/22 - 11/24/22    Authorization - Number of Visits 20    Progress Note Due on Visit 20    PT Start Time 0816    PT Stop Time 0900    PT Time Calculation (min) 44 min    Equipment Utilized During Treatment Gait belt    Activity Tolerance Patient tolerated treatment well    Behavior During Therapy WFL for tasks assessed/performed               Past Medical History:  Diagnosis Date   Arthritis    Diverticulosis, sigmoid 08/12/2014   Moderate, noted on colonoscopy   History of adenomatous polyp of colon    Hyperlipidemia    Hypertension    followed by pcp   (03-16-2020  per pt had stress test several years ago, told ok)   Left inguinal hernia    Prostate cancer [facility]) urologist-- dr Mena Goes   dx 2019,  Stage T1c, Gleason 3+4;  s/p  brachytherapy 03-08-2018   Wears glasses    Past Surgical History:  Procedure Laterality Date   COLONOSCOPY W/ POLYPECTOMY  last one 12-18-2019  dr Marina Goodell   CYSTOSCOPY N/A 03/08/2018   Procedure: CYSTOSCOPY;  Surgeon: Jerilee Field, MD;  Location: [facility];  Service: Urology;  Laterality: N/A;  no seeds in bladder per dr eskridge   INGUINAL HERNIA REPAIR Left 03/19/2020   Procedure: LEFT INGUINAL HERNIA REPAIR;  Surgeon: Sheliah Hatch, De Blanch, MD;  Location: [facility];  Service: General;  Laterality: Left;   INSERTION OF MESH Left 03/19/2020   Procedure: INSERTION OF MESH;  Surgeon: Kinsinger, De Blanch, MD;  Location: [facility];  Service: General;  Laterality: Left;   PROSTATE BIOPSY  10/16/2017   RADIOACTIVE SEED IMPLANT N/A  03/08/2018   Procedure: RADIOACTIVE SEED IMPLANT/BRACHYTHERAPY IMPLANT;  Surgeon: Jerilee Field, MD;  Location: [facility];  Service: Urology;  Laterality: N/A;  86 seeds   SPACE OAR INSTILLATION N/A 03/08/2018   Procedure: SPACE OAR INSTILLATION;  Surgeon: Jerilee Field, MD;  Location: [facility];  Service: Urology;  Laterality: N/A;   TONSILLECTOMY  child   TOTAL KNEE ARTHROPLASTY Left 08/25/2022   Procedure: LEFT TOTAL KNEE ARTHROPLASTY;  Surgeon: Kathryne Hitch, MD;  Location: [facility];  Service: Orthopedics;  Laterality: Left;   Patient Active Problem List   Diagnosis Date Noted   Status post total left knee replacement 08/25/2022   Malignant neoplasm of prostate (HCC) 12/10/2017    PCP: Eloise Harman MD  REFERRING PROVIDER: Magnus Ivan MD  REFERRING DIAG: L TKA  THERAPY DIAG:  Acute pain of left knee  Rationale for Evaluation and Treatment: Rehabilitation  ONSET DATE: 08/25/22  SUBJECTIVE:   SUBJECTIVE STATEMENT:  Pt reports he is experiencing more swelling today than normal and feels more stiff today..  Pt notes that he did not do too much and stayed home due to the wet weather over the weekend.  PERTINENT HISTORY:  Pt is an 82 year old male presenting s/p L TKA 08/25/22. PLOF ambulation without AD, complete ind  with all ADLs. Currently ambulating with RW in the home and community. Lives at home with wife, with family in the area. His daughter is staying with him to make meals, clean, drive, etc. Reports he is dressing himself though he does need assistance from daughter to don/doff ted hose. Home with ramped entry, single level, walk in shower with seat and grab bars, and standard height toilet seat. Current pain level 5/10 best: /10 worst 8/10. Pt has had HHPT since surgery, and is doing mini squats, and walking down the hall at home. Is taking oxycodone as needed for pain. No falls in past 6 months. Pt denies N/V, B&B changes, unexplained  weight fluctuation, saddle paresthesia, fever, night sweats, or unrelenting night pain at this time.   PAIN:  Are you having pain? Yes: NPRS scale: 5/10 Pain location: L knee Pain description: aching Aggravating factors: standing, walking Relieving factors: rest  PRECAUTIONS: None  WEIGHT BEARING RESTRICTIONS: No  FALLS:  Has patient fallen in last 6 months? No  LIVING ENVIRONMENT: Lives with: lives with their family Lives in: House/apartment Stairs: No Has following equipment at home: Single point cane, Environmental consultant - 2 wheeled, Tour manager, Grab bars, and Ramped entry  OCCUPATION: Retired  PLOF: Independent with basic ADLs  PATIENT GOALS: Be able to walk without RW  NEXT MD VISIT: 10/30/22  OBJECTIVE:   DIAGNOSTIC FINDINGS:   PATIENT SURVEYS:  FOTO 36 goal 46  COGNITION: Overall cognitive status: Within functional limits for tasks assessed     SENSATION: WFL  EDEMA:  Circumferential: L mid patella 46cm; R mid patell 41cm  MUSCLE LENGTH: Hamstrings: shortened bilat Thomas test: shortened RLE; unable to test on L  POSTURE: rounded shoulders, forward head, flexed trunk , and weight shift right  PALPATION: Swelling inhibiting patellar movement; ttp diffuse of L knee  LOWER EXTREMITY ROM:  Active ROM Right eval Left eval  Hip flexion 110/110 90/110  Hip extension ~5/10 bilat  Hip abduction    Hip adduction    Hip internal rotation 50% limited bilat  Hip external rotation    Knee flexion 128 75/78  Knee extension 0 25/22  Ankle dorsiflexion 15 14  Ankle plantarflexion    Ankle inversion    Ankle eversion     (Blank rows = not tested)  LOWER EXTREMITY MMT:  MMT Right eval Left eval  Hip flexion 4+ 2+  Hip extension 3- 2+  Hip abduction 4- 3+  Hip adduction    Hip internal rotation 5 4+  Hip external rotation 4+ 4  Knee flexion 4+ 3+  Knee extension 4+ 3  Ankle dorsiflexion 4+ 4+  Ankle plantarflexion    Ankle inversion    Ankle eversion      (Blank rows = not tested)   FUNCTIONAL TESTS:  5 times sit to stand: 40sec with UE use for push off 10 meter walk test: self selected: 0.83m/s fastest: 0.48m/s SLS unable bilat  GAIT: Distance walked: 62M Assistive device utilized: Environmental consultant - 2 wheeled Level of assistance: Modified independence Comments: decreased L stance time; decreased RLE step length, heavy reliance on RW with RLE swing    TODAY'S TREATMENT: DATE: 11/13/22  TherEx:  Recumbent bike AAROM knee flex/ext seat 11, pt performed pedaling forward/backward, 8 mins Seated leg extension at OMEGA, 35#, 2x10 Seated hamstring curls at OMEGA, 45#, 2x10 Seated single leg (L LE) hamstring curls at OMEGA, 15# x10, 20# x10 Seated leg press at OMEGA, 65# x10, 85# x10 TRX squat with CGA for  safety, x15 (given tactile and visual feedback for proper form to improve knee ROM) Standing terminal knee extension into physioball at wall, 3 sec holds, 2x10 Supine SLR with 4# AW donned, x 15 Supine SAQ with brown bolster under knee, 4# AW on each LE 2x15 2 sec holds  Heel prop stretch   Femur G3-4 AP mob in above position 5 x 15 sec bouts, shorter bouts due to pain. Ice applied to reduce swelling and improve pain tolerance while performing exercises     PATIENT EDUCATION:  Education details: Patient was educated on diagnosis, anatomy and pathology involved, prognosis, role of PT, and was given an HEP, demonstrating exercise with proper form following verbal and tactile cues, and was given a paper hand out to continue exercise at home. Pt was educated on and agreed to plan of care.  Person educated: Patient Education method: Explanation, Demonstration, and Verbal cues Education comprehension: verbalized understanding, returned demonstration, and verbal cues required  HOME EXERCISE PROGRAM: [id number]  ASSESSMENT:  CLINICAL IMPRESSION:  Pt continues to demonstrate lack of ROM during exercises, however unlikely to achieve full  ROM at this time.  Time devoted to performing strength training within the ROM that the pt is able to achieve.  Pt noted to have very few remaining appointments and pt and therapist will work together in order to arrange HEP that will improve strength at home.   Pt will continue to benefit from skilled therapy to address remaining deficits in order to improve overall QoL and return to PLOF.       OBJECTIVE IMPAIRMENTS: carrying, lifting, bending, standing, squatting, stairs, transfers, bed mobility, bathing, dressing, hygiene/grooming, and caring for others.   ACTIVITY LIMITATIONS: carrying, lifting, bending, sitting, standing, squatting, sleeping, transfers, bed mobility, bathing, toileting, and locomotion level  PARTICIPATION LIMITATIONS: meal prep, cleaning, laundry, shopping, community activity, and yard work  PERSONAL FACTORS: Age, Fitness, Past/current experiences, Time since onset of injury/illness/exacerbation, and 3+ comorbidities: HLD, HTN, athritis, prostate cancer  are also affecting patient's functional outcome.   REHAB POTENTIAL: Good  CLINICAL DECISION MAKING: Evolving/moderate complexity  EVALUATION COMPLEXITY: Moderate   GOALS: Goals reviewed with patient? Yes  SHORT TERM GOALS: Target date: 10/08/22 Pt will be independent with HEP in order to improve strength and balance in order to decrease fall risk and improve function at home and work.  Baseline: HEP given; 10/11/22 HEP updated Goal status: ongoing   LONG TERM GOALS: Target date: 11/17/22  Pt will demonstrate L knee mobility of 0-120d in order to demonstrate mobility needed for normalized gait Baseline: 22-75;  10/11/22: 20-109 11/06/22: 18-109 deg Goal status: ONGOING  2.  Patient will increase FOTO score to 61 to demonstrate predicted increase in functional mobility to complete ADLs Baseline: 36 11/08/22: 63 Goal status: MET but continue to monitor  3.  Pt will demonstrate 5TSTS by at least 13 seconds in  order to demonstrate age matched LE strength needed for ind transfers Baseline: 40 sec 11/08/22: 14.34 sec Goal status: IN PROGRESS  4.  Pt will increase to at least 1.22m/s in order to demonstrate community ambulation speed with decreased fall risk  Baseline: self selected: 0.84m/s fastest: 0.47m/s 11/08/22: self selected: 0.94m/s; fastest: 0.46m/s Goal status: INITIAL     PLAN:  PT FREQUENCY: 1-2x/week  PT DURATION: 8 weeks  PLANNED INTERVENTIONS: Therapeutic exercises, Therapeutic activity, Neuromuscular re-education, Balance training, Gait training, Patient/Family education, Self Care, Joint mobilization, Joint manipulation, Stair training, Dry Needling, and Electrical stimulation  PLAN FOR NEXT SESSION:  assess HEP and continue progress as necessary.    Nolon Bussing, PT, DPT Physical Therapist - [facility]  11/13/22, 8:19 AM

## 2022-11-15 ENCOUNTER — Ambulatory Visit: Payer: Medicare PPO

## 2022-11-15 DIAGNOSIS — M25562 Pain in left knee: Secondary | ICD-10-CM | POA: Diagnosis not present

## 2022-11-15 NOTE — Therapy (Signed)
OUTPATIENT PHYSICAL THERAPY LOWER EXTREMITY TREATMENT/PHYSICAL THERAPY PROGRESS NOTE   Dates of reporting period  10/11/2022   to   11/15/22     Patient Name: Robert [name] MRN: [id number] DOB:1941-02-09, 82 y.o., male Today's Date: 11/15/2022   END OF SESSION:  PT End of Session - 11/15/22 0951     Visit Number 20    Number of Visits 25    Date for PT Re-Evaluation 12/04/22    Authorization Type [employer]    Authorization Time Period 20 visits 09/15/22 - 11/24/22    Authorization - Visit Number 20    Authorization - Number of Visits 20    Progress Note Due on Visit 20    PT Start Time 0947    PT Stop Time 1030    PT Time Calculation (min) 43 min    Equipment Utilized During Treatment Gait belt    Activity Tolerance Patient tolerated treatment well    Behavior During Therapy WFL for tasks assessed/performed               Past Medical History:  Diagnosis Date   Arthritis    Diverticulosis, sigmoid 08/12/2014   Moderate, noted on colonoscopy   History of adenomatous polyp of colon    Hyperlipidemia    Hypertension    followed by pcp   (03-16-2020  per pt had stress test several years ago, told ok)   Left inguinal hernia    Prostate cancer [facility]) urologist-- dr Mena Goes   dx 2019,  Stage T1c, Gleason 3+4;  s/p  brachytherapy 03-08-2018   Wears glasses    Past Surgical History:  Procedure Laterality Date   COLONOSCOPY W/ POLYPECTOMY  last one 12-18-2019  dr Marina Goodell   CYSTOSCOPY N/A 03/08/2018   Procedure: CYSTOSCOPY;  Surgeon: Jerilee Field, MD;  Location: [facility];  Service: Urology;  Laterality: N/A;  no seeds in bladder per dr eskridge   INGUINAL HERNIA REPAIR Left 03/19/2020   Procedure: LEFT INGUINAL HERNIA REPAIR;  Surgeon: Sheliah Hatch, De Blanch, MD;  Location: [facility];  Service: General;  Laterality: Left;   INSERTION OF MESH Left 03/19/2020   Procedure: INSERTION OF MESH;  Surgeon: Kinsinger, De Blanch, MD;  Location: [facility];  Service: General;  Laterality: Left;   PROSTATE BIOPSY  10/16/2017   RADIOACTIVE SEED IMPLANT N/A 03/08/2018   Procedure: RADIOACTIVE SEED IMPLANT/BRACHYTHERAPY IMPLANT;  Surgeon: Jerilee Field, MD;  Location: [facility];  Service: Urology;  Laterality: N/A;  86 seeds   SPACE OAR INSTILLATION N/A 03/08/2018   Procedure: SPACE OAR INSTILLATION;  Surgeon: Jerilee Field, MD;  Location: [facility];  Service: Urology;  Laterality: N/A;   TONSILLECTOMY  child   TOTAL KNEE ARTHROPLASTY Left 08/25/2022   Procedure: LEFT TOTAL KNEE ARTHROPLASTY;  Surgeon: Kathryne Hitch, MD;  Location: [facility];  Service: Orthopedics;  Laterality: Left;   Patient Active Problem List   Diagnosis Date Noted   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  PERTINENT HISTORY:  Pt is an 82  year old male presenting s/p L TKA 08/25/22. PLOF ambulation without AD, complete ind with all ADLs. Currently ambulating with RW in the home and community. Lives at home with wife, with family in the area. His daughter is staying with him to make meals, clean, drive, etc. Reports he is dressing himself though he does need assistance from daughter to don/doff ted hose. Home with ramped entry, single level, walk in shower with seat and grab bars, and standard height toilet seat. Current pain level 5/10 best: /10 worst 8/10. Pt has had HHPT since surgery, and is doing mini squats, and walking down the hall at home. Is taking oxycodone as needed for pain. No falls in  past 6 months. Pt denies N/V, B&B changes, unexplained weight fluctuation, saddle paresthesia, fever, night sweats, or unrelenting night pain at this time.   PAIN:  Are you having pain? Yes: NPRS scale: 5/10 Pain location: L knee Pain description: aching Aggravating factors: standing, walking Relieving factors: rest  PRECAUTIONS: None  WEIGHT BEARING RESTRICTIONS: No  FALLS:  Has patient fallen in last 6 months? No  LIVING ENVIRONMENT: Lives with: lives with their family Lives in: House/apartment Stairs: No Has following equipment at home: Single point cane, Environmental consultant - 2 wheeled, Tour manager, Grab bars, and Ramped entry  OCCUPATION: Retired  PLOF: Independent with basic ADLs  PATIENT GOALS: Be able to walk without RW  NEXT MD VISIT: 10/30/22  OBJECTIVE:   DIAGNOSTIC FINDINGS:   PATIENT SURVEYS:  FOTO 36 goal 6  COGNITION: Overall cognitive status: Within functional limits for tasks assessed     SENSATION: WFL  EDEMA:  Circumferential: L mid patella 46cm; R mid patell 41cm  MUSCLE LENGTH: Hamstrings: shortened bilat Thomas test: shortened RLE; unable to test on L  POSTURE: rounded shoulders, forward head, flexed trunk , and weight shift right  PALPATION: Swelling inhibiting patellar movement; ttp diffuse of L knee  LOWER EXTREMITY ROM:  Active ROM Right eval Left eval  Hip flexion 110/110 90/110  Hip extension ~5/10 bilat  Hip abduction    Hip adduction    Hip internal rotation 50% limited bilat  Hip external rotation    Knee flexion 128 75/78  Knee extension 0 25/22  Ankle dorsiflexion 15 14  Ankle plantarflexion    Ankle inversion    Ankle eversion     (Blank rows = not tested)  LOWER EXTREMITY MMT:  MMT Right eval Left eval  Hip flexion 4+ 2+  Hip extension 3- 2+  Hip abduction 4- 3+  Hip adduction    Hip internal rotation 5 4+  Hip external rotation 4+ 4  Knee flexion 4+ 3+  Knee extension 4+ 3  Ankle dorsiflexion 4+ 4+  Ankle  plantarflexion    Ankle inversion    Ankle eversion     (Blank rows = not tested)   FUNCTIONAL TESTS:  5 times sit to stand: 40sec with UE use for push off 10 meter walk test: self selected: 0.65m/s fastest: 0.7m/s SLS unable bilat  GAIT: Distance walked: 68M Assistive device utilized: Environmental consultant - 2 wheeled Level of assistance: Modified independence Comments: decreased L stance time; decreased RLE step length, heavy reliance on RW with RLE swing    TODAY'S TREATMENT: DATE: 11/15/22  TherEx:   Recumbent bike AAROM knee flex/ext seat 11, pt performed pedaling forward/backward, 8 mins Backwards walking on the treadmill, 2.5 min, 0.8 speed   Seated leg extension at OMEGA, 35#, 2x10 Seated hamstring curls at OMEGA, 45#, 2x10  Goal assessment performed as noted below:     PATIENT EDUCATION:  Education details: Patient was educated on diagnosis, anatomy and pathology involved, prognosis, role of PT, and was given an HEP, demonstrating exercise with proper form following verbal and tactile cues, and was given a paper hand out to continue exercise at home. Pt was educated on and agreed to plan of care.  Person educated: Patient Education method: Explanation, Demonstration, and Verbal cues Education comprehension: verbalized understanding, returned demonstration, and verbal cues required  HOME EXERCISE PROGRAM: [id number]  ASSESSMENT:  CLINICAL IMPRESSION:  Pt has been able to progress with goals over the past week and is demonstrating slightly improved ROM, however seems to be hitting a plateau.  Pt advised to continue with his current HEP as it is going to beneficial for improving ROM and strength.  Pt advised that ambulating is important, however needs to perform the HEP in order to target the specific muscle groups that are currently weak and need strengthening.  Pt will continue to benefit from skilled therapy to address remaining deficits in order to improve overall QoL and  return to PLOF.       OBJECTIVE IMPAIRMENTS: carrying, lifting, bending, standing, squatting, stairs, transfers, bed mobility, bathing, dressing, hygiene/grooming, and caring for others.   ACTIVITY LIMITATIONS: carrying, lifting, bending, sitting, standing, squatting, sleeping, transfers, bed mobility, bathing, toileting, and locomotion level  PARTICIPATION LIMITATIONS: meal prep, cleaning, laundry, shopping, community activity, and yard work  PERSONAL FACTORS: Age, Fitness, Past/current experiences, Time since onset of injury/illness/exacerbation, and 3+ comorbidities: HLD, HTN, athritis, prostate cancer  are also affecting patient's functional outcome.   REHAB POTENTIAL: Good  CLINICAL DECISION MAKING: Evolving/moderate complexity  EVALUATION COMPLEXITY: Moderate   GOALS: Goals reviewed with patient? Yes  SHORT TERM GOALS: Target date: 10/08/22 Pt will be independent with HEP in order to improve strength and balance in order to decrease fall risk and improve function at home and work.  Baseline: HEP given; 10/11/22 HEP updated Goal status: ongoing   LONG TERM GOALS: Target date: 11/17/22  Pt will demonstrate L knee mobility of 0-120d in order to demonstrate mobility needed for normalized gait Baseline: 22-75;  10/11/22: 20-109 11/06/22: 18-109 deg 11/15/22: 17-111 deg Goal status: ONGOING  2.  Patient will increase FOTO score to 61 to demonstrate predicted increase in functional mobility to complete ADLs Baseline: 36 11/08/22: 63 11/15/22: 62 Goal status: MET but continue to monitor  3.  Pt will demonstrate 5TSTS by at least 13 seconds in order to demonstrate age matched LE strength needed for ind transfers Baseline: 40 sec 11/08/22: 14.34 sec 11/15/22: 11.23 sec Goal status: IN PROGRESS  4.  Pt will increase to at least 1.38m/s in order to demonstrate community ambulation speed with decreased fall risk  Baseline: self selected: 0.54m/s fastest: 0.50m/s 11/08/22: self  selected: 0.67m/s; fastest: 0.38m/s 11/15/22: self selected: 0.50m/s; fastest: 1.31m/s Goal status: INITIAL     PLAN:  PT FREQUENCY: 1-2x/week  PT DURATION: 8 weeks  PLANNED INTERVENTIONS: Therapeutic exercises, Therapeutic activity, Neuromuscular re-education, Balance training, Gait training, Patient/Family education, Self Care, Joint mobilization, Joint manipulation, Stair training, Dry Needling, and Electrical stimulation  PLAN FOR NEXT SESSION:  assess HEP and continue progress as necessary.    Nolon Bussing, PT, DPT Physical Therapist - [facility]  11/15/22, 11:12 AM

## 2022-11-20 ENCOUNTER — Ambulatory Visit: Payer: Medicare PPO

## 2022-11-20 DIAGNOSIS — M25562 Pain in left knee: Secondary | ICD-10-CM | POA: Diagnosis not present

## 2022-11-20 NOTE — Therapy (Signed)
OUTPATIENT PHYSICAL THERAPY LOWER EXTREMITY TREATMENT     Patient Name: SONNIE ARRINDELL MRN: [id number] DOB:04-10-1941, 82 y.o., male Today's Date: 11/20/2022   END OF SESSION:  PT End of Session - 11/20/22 0939     Visit Number 21    Number of Visits 25    Date for PT Re-Evaluation 12/04/22    Authorization Type [employer]    Authorization Time Period 20 visits 09/15/22 - 11/24/22    Authorization - Number of Visits 20    Progress Note Due on Visit 20    PT Start Time 0940    PT Stop Time 1020    PT Time Calculation (min) 40 min    Equipment Utilized During Treatment Gait belt    Activity Tolerance Patient tolerated treatment well    Behavior During Therapy WFL for tasks assessed/performed               Past Medical History:  Diagnosis Date   Arthritis    Diverticulosis, sigmoid 08/12/2014   Moderate, noted on colonoscopy   History of adenomatous polyp of colon    Hyperlipidemia    Hypertension    followed by pcp   (03-16-2020  per pt had stress test several years ago, told ok)   Left inguinal hernia    Prostate cancer [facility]) urologist-- dr Mena Goes   dx 2019,  Stage T1c, Gleason 3+4;  s/p  brachytherapy 03-08-2018   Wears glasses    Past Surgical History:  Procedure Laterality Date   COLONOSCOPY W/ POLYPECTOMY  last one 12-18-2019  dr Marina Goodell   CYSTOSCOPY N/A 03/08/2018   Procedure: CYSTOSCOPY;  Surgeon: Jerilee Field, MD;  Location: [facility];  Service: Urology;  Laterality: N/A;  no seeds in bladder per dr eskridge   INGUINAL HERNIA REPAIR Left 03/19/2020   Procedure: LEFT INGUINAL HERNIA REPAIR;  Surgeon: Sheliah Hatch, De Blanch, MD;  Location: [facility];  Service: General;  Laterality: Left;   INSERTION OF MESH Left 03/19/2020   Procedure: INSERTION OF MESH;  Surgeon: Kinsinger, De Blanch, MD;  Location: [facility];  Service: General;  Laterality: Left;   PROSTATE BIOPSY  10/16/2017   RADIOACTIVE SEED IMPLANT N/A  03/08/2018   Procedure: RADIOACTIVE SEED IMPLANT/BRACHYTHERAPY IMPLANT;  Surgeon: Jerilee Field, MD;  Location: [facility];  Service: Urology;  Laterality: N/A;  86 seeds   SPACE OAR INSTILLATION N/A 03/08/2018   Procedure: SPACE OAR INSTILLATION;  Surgeon: Jerilee Field, MD;  Location: [facility];  Service: Urology;  Laterality: N/A;   TONSILLECTOMY  child   TOTAL KNEE ARTHROPLASTY Left 08/25/2022   Procedure: LEFT TOTAL KNEE ARTHROPLASTY;  Surgeon: Kathryne Hitch, MD;  Location: [facility];  Service: Orthopedics;  Laterality: Left;   Patient Active Problem List   Diagnosis Date Noted   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   PERTINENT HISTORY:  Pt is an 82 year old male presenting s/p L TKA 08/25/22. PLOF ambulation without AD, complete ind with all ADLs. Currently ambulating with RW in the home and community. Lives at home with wife, with family  in the area. His daughter is staying with him to make meals, clean, drive, etc. Reports he is dressing himself though he does need assistance from daughter to don/doff ted hose. Home with ramped entry, single level, walk in shower with seat and grab bars, and standard height toilet seat. Current pain level 5/10 best: /10 worst 8/10. Pt has had HHPT since surgery, and is doing mini squats, and walking down the hall at home. Is taking oxycodone as needed for pain. No falls in past 6 months. Pt denies N/V, B&B changes, unexplained weight fluctuation, saddle paresthesia, fever, night sweats, or unrelenting night pain at this  time.   PAIN:  Are you having pain? Yes: NPRS scale: 5/10 Pain location: L knee Pain description: aching Aggravating factors: standing, walking Relieving factors: rest  PRECAUTIONS: None  WEIGHT BEARING RESTRICTIONS: No  FALLS:  Has patient fallen in last 6 months? No  LIVING ENVIRONMENT: Lives with: lives with their family Lives in: House/apartment Stairs: No Has following equipment at home: Single point cane, Environmental consultant - 2 wheeled, Tour manager, Grab bars, and Ramped entry  OCCUPATION: Retired  PLOF: Independent with basic ADLs  PATIENT GOALS: Be able to walk without RW  NEXT MD VISIT: 10/30/22  OBJECTIVE:   DIAGNOSTIC FINDINGS:   PATIENT SURVEYS:  FOTO 36 goal 35  COGNITION: Overall cognitive status: Within functional limits for tasks assessed     SENSATION: WFL  EDEMA:  Circumferential: L mid patella 46cm; R mid patell 41cm  MUSCLE LENGTH: Hamstrings: shortened bilat Thomas test: shortened RLE; unable to test on L  POSTURE: rounded shoulders, forward head, flexed trunk , and weight shift right  PALPATION: Swelling inhibiting patellar movement; ttp diffuse of L knee  LOWER EXTREMITY ROM:  Active ROM Right eval Left eval  Hip flexion 110/110 90/110  Hip extension ~5/10 bilat  Hip abduction    Hip adduction    Hip internal rotation 50% limited bilat  Hip external rotation    Knee flexion 128 75/78  Knee extension 0 25/22  Ankle dorsiflexion 15 14  Ankle plantarflexion    Ankle inversion    Ankle eversion     (Blank rows = not tested)  LOWER EXTREMITY MMT:  MMT Right eval Left eval  Hip flexion 4+ 2+  Hip extension 3- 2+  Hip abduction 4- 3+  Hip adduction    Hip internal rotation 5 4+  Hip external rotation 4+ 4  Knee flexion 4+ 3+  Knee extension 4+ 3  Ankle dorsiflexion 4+ 4+  Ankle plantarflexion    Ankle inversion    Ankle eversion     (Blank rows = not tested)   FUNCTIONAL TESTS:  5 times sit to stand: 40sec with UE use  for push off 10 meter walk test: self selected: 0.44m/s fastest: 0.66m/s SLS unable bilat  GAIT: Distance walked: 49M Assistive device utilized: Environmental consultant - 2 wheeled Level of assistance: Modified independence Comments: decreased L stance time; decreased RLE step length, heavy reliance on RW with RLE swing    TODAY'S TREATMENT: DATE: 11/20/22  TherEx:   Recumbent bike AAROM knee flex/ext seat 11, pt performed pedaling forward/backward, 8 mins total, 2.5 min forward/2.5 min backward Backwards walking on the treadmill, 2.5 min, 0.8 speed  Standing on incline board for calf and hamstring, 30 sec bouts x4 Seated leg press, 85# x10, second set: 95# x10 with green physioball for increased hip adduction Standing hip flexion at Matrix, 70#, 2x10 Standing hip abduction at Matrix, 70#, 2x10 Standing  hip extension at Matrix, 70#, 2x10 Seated leg extension at OMEGA, 35#, x10; 45#, x10 Seated hamstring curls at OMEGA, 45#, x10; 55#, x10 Standing terminal knee extension into green physioball, 3 sec holds, x10      PATIENT EDUCATION:  Education details: Patient was educated on diagnosis, anatomy and pathology involved, prognosis, role of PT, and was given an HEP, demonstrating exercise with proper form following verbal and tactile cues, and was given a paper hand out to continue exercise at home. Pt was educated on and agreed to plan of care.  Person educated: Patient Education method: Explanation, Demonstration, and Verbal cues Education comprehension: verbalized understanding, returned demonstration, and verbal cues required  HOME EXERCISE PROGRAM: [id number]  ASSESSMENT:  CLINICAL IMPRESSION:  Pt continues to perform well and continues to put forth great effort throughout the session.  Pt still lacking significant ROM however has increased the strength in the LE's, specifically in the L LE.  Pt will continue to improve strength within the ROM that he has and will plan on discharging at  the next visit.  Pt has made all marked improvement that he will likely meet and ROM has not improved much likely due to lack of progress made at initial stages.   Pt will continue to benefit from skilled therapy to address remaining deficits in order to improve overall QoL and return to PLOF.        OBJECTIVE IMPAIRMENTS: carrying, lifting, bending, standing, squatting, stairs, transfers, bed mobility, bathing, dressing, hygiene/grooming, and caring for others.   ACTIVITY LIMITATIONS: carrying, lifting, bending, sitting, standing, squatting, sleeping, transfers, bed mobility, bathing, toileting, and locomotion level  PARTICIPATION LIMITATIONS: meal prep, cleaning, laundry, shopping, community activity, and yard work  PERSONAL FACTORS: Age, Fitness, Past/current experiences, Time since onset of injury/illness/exacerbation, and 3+ comorbidities: HLD, HTN, athritis, prostate cancer  are also affecting patient's functional outcome.   REHAB POTENTIAL: Good  CLINICAL DECISION MAKING: Evolving/moderate complexity  EVALUATION COMPLEXITY: Moderate   GOALS: Goals reviewed with patient? Yes  SHORT TERM GOALS: Target date: 10/08/22 Pt will be independent with HEP in order to improve strength and balance in order to decrease fall risk and improve function at home and work.  Baseline: HEP given; 10/11/22 HEP updated Goal status: ongoing   LONG TERM GOALS: Target date: 11/17/22  Pt will demonstrate L knee mobility of 0-120d in order to demonstrate mobility needed for normalized gait Baseline: 22-75;  10/11/22: 20-109 11/06/22: 18-109 deg 11/15/22: 17-111 deg Goal status: ONGOING  2.  Patient will increase FOTO score to 61 to demonstrate predicted increase in functional mobility to complete ADLs Baseline: 36 11/08/22: 63 11/15/22: 62 Goal status: MET but continue to monitor  3.  Pt will demonstrate 5TSTS by at least 13 seconds in order to demonstrate age matched LE strength needed for ind  transfers Baseline: 40 sec 11/08/22: 14.34 sec 11/15/22: 11.23 sec Goal status: IN PROGRESS  4.  Pt will increase to at least 1.39m/s in order to demonstrate community ambulation speed with decreased fall risk  Baseline: self selected: 0.42m/s fastest: 0.80m/s 11/08/22: self selected: 0.64m/s; fastest: 0.36m/s 11/15/22: self selected: 0.41m/s; fastest: 1.64m/s Goal status: INITIAL     PLAN:  PT FREQUENCY: 1-2x/week  PT DURATION: 8 weeks  PLANNED INTERVENTIONS: Therapeutic exercises, Therapeutic activity, Neuromuscular re-education, Balance training, Gait training, Patient/Family education, Self Care, Joint mobilization, Joint manipulation, Stair training, Dry Needling, and Electrical stimulation  PLAN FOR NEXT SESSION:  d/c    Nolon Bussing, PT, DPT Physical Therapist -  [facility]  [facility]  11/20/22, 10:30 AM

## 2022-11-22 ENCOUNTER — Ambulatory Visit: Payer: Medicare PPO

## 2022-11-22 DIAGNOSIS — M25562 Pain in left knee: Secondary | ICD-10-CM

## 2022-11-22 NOTE — Therapy (Signed)
OUTPATIENT PHYSICAL THERAPY LOWER EXTREMITY TREATMENT     Patient Name: Robert [name] MRN: [id number] DOB:12-05-1940, 82 y.o., male Today's Date: 11/22/2022   END OF SESSION:  PT End of Session - 11/22/22 0951     Visit Number 22    Number of Visits 25    Date for PT Re-Evaluation 12/04/22    Authorization Type [employer]    Authorization Time Period 20 visits 09/15/22 - 11/24/22    Authorization - Number of Visits 20    Progress Note Due on Visit 20    PT Start Time 0951    PT Stop Time 1016    PT Time Calculation (min) 25 min    Equipment Utilized During Treatment Gait belt    Activity Tolerance Patient tolerated treatment well    Behavior During Therapy WFL for tasks assessed/performed               Past Medical History:  Diagnosis Date   Arthritis    Diverticulosis, sigmoid 08/12/2014   Moderate, noted on colonoscopy   History of adenomatous polyp of colon    Hyperlipidemia    Hypertension    followed by pcp   (03-16-2020  per pt had stress test several years ago, told ok)   Left inguinal hernia    Prostate cancer [facility]) urologist-- dr Mena Goes   dx 2019,  Stage T1c, Gleason 3+4;  s/p  brachytherapy 03-08-2018   Wears glasses    Past Surgical History:  Procedure Laterality Date   COLONOSCOPY W/ POLYPECTOMY  last one 12-18-2019  dr Marina Goodell   CYSTOSCOPY N/A 03/08/2018   Procedure: CYSTOSCOPY;  Surgeon: Jerilee Field, MD;  Location: [facility];  Service: Urology;  Laterality: N/A;  no seeds in bladder per dr eskridge   INGUINAL HERNIA REPAIR Left 03/19/2020   Procedure: LEFT INGUINAL HERNIA REPAIR;  Surgeon: Sheliah Hatch, De Blanch, MD;  Location: [facility];  Service: General;  Laterality: Left;   INSERTION OF MESH Left 03/19/2020   Procedure: INSERTION OF MESH;  Surgeon: Kinsinger, De Blanch, MD;  Location: [facility];  Service: General;  Laterality: Left;   PROSTATE BIOPSY  10/16/2017   RADIOACTIVE SEED IMPLANT N/A  03/08/2018   Procedure: RADIOACTIVE SEED IMPLANT/BRACHYTHERAPY IMPLANT;  Surgeon: Jerilee Field, MD;  Location: [facility] ;  Service: Urology;  Laterality: N/A;  86 seeds   SPACE OAR INSTILLATION N/A 03/08/2018   Procedure: SPACE OAR INSTILLATION;  Surgeon: Jerilee Field, MD;  Location: [facility];  Service: Urology;  Laterality: N/A;   TONSILLECTOMY  child   TOTAL KNEE ARTHROPLASTY Left 08/25/2022   Procedure: LEFT TOTAL KNEE ARTHROPLASTY;  Surgeon: Kathryne Hitch, MD;  Location: [facility];  Service: Orthopedics;  Laterality: Left;   Patient Active Problem List   Diagnosis Date Noted   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   PERTINENT HISTORY:  Pt is an 82 year old male presenting s/p L TKA 08/25/22. PLOF ambulation without AD, complete ind with all ADLs. Currently ambulating with RW in the home and community. Lives at home with wife, with family  in the area. His daughter is staying with him to make meals, clean, drive, etc. Reports he is dressing himself though he does need assistance from daughter to don/doff ted hose. Home with ramped entry, single level, walk in shower with seat and grab bars, and standard height toilet seat. Current pain level 5/10 best: /10 worst 8/10. Pt has had HHPT since surgery, and is doing mini squats, and walking down the hall at home. Is taking oxycodone as needed for pain. No falls in past 6 months. Pt denies N/V, B&B changes, unexplained weight fluctuation, saddle paresthesia, fever, night sweats, or unrelenting night pain at this  time.   PAIN:  Are you having pain? Yes: NPRS scale: 5/10 Pain location: L knee Pain description: aching Aggravating factors: standing, walking Relieving factors: rest  PRECAUTIONS: None  WEIGHT BEARING RESTRICTIONS: No  FALLS:  Has patient fallen in last 6 months? No  LIVING ENVIRONMENT: Lives with: lives with their family Lives in: House/apartment Stairs: No Has following equipment at home: Single point cane, Environmental consultant - 2 wheeled, Tour manager, Grab bars, and Ramped entry  OCCUPATION: Retired  PLOF: Independent with basic ADLs  PATIENT GOALS: Be able to walk without RW  NEXT MD VISIT: 10/30/22  OBJECTIVE:   DIAGNOSTIC FINDINGS:   PATIENT SURVEYS:  FOTO 36 goal 29  COGNITION: Overall cognitive status: Within functional limits for tasks assessed     SENSATION: WFL  EDEMA:  Circumferential: L mid patella 46cm; R mid patell 41cm  MUSCLE LENGTH: Hamstrings: shortened bilat Thomas test: shortened RLE; unable to test on L  POSTURE: rounded shoulders, forward head, flexed trunk , and weight shift right  PALPATION: Swelling inhibiting patellar movement; ttp diffuse of L knee  LOWER EXTREMITY ROM:  Active ROM Right eval Left eval  Hip flexion 110/110 90/110  Hip extension ~5/10 bilat  Hip abduction    Hip adduction    Hip internal rotation 50% limited bilat  Hip external rotation    Knee flexion 128 75/78  Knee extension 0 25/22  Ankle dorsiflexion 15 14  Ankle plantarflexion    Ankle inversion    Ankle eversion     (Blank rows = not tested)  LOWER EXTREMITY MMT:  MMT Right eval Left eval  Hip flexion 4+ 2+  Hip extension 3- 2+  Hip abduction 4- 3+  Hip adduction    Hip internal rotation 5 4+  Hip external rotation 4+ 4  Knee flexion 4+ 3+  Knee extension 4+ 3  Ankle dorsiflexion 4+ 4+  Ankle plantarflexion    Ankle inversion    Ankle eversion     (Blank rows = not tested)   FUNCTIONAL TESTS:  5 times sit to stand: 40sec with UE use  for push off 10 meter walk test: self selected: 0.26m/s fastest: 0.46m/s SLS unable bilat  GAIT: Distance walked: 71M Assistive device utilized: Environmental consultant - 2 wheeled Level of assistance: Modified independence Comments: decreased L stance time; decreased RLE step length, heavy reliance on RW with RLE swing    TODAY'S TREATMENT: DATE: 11/22/22  TherEx:   Recumbent bike AAROM knee flex/ext seat 11, pt performed pedaling forward/backward, 8 mins total, 2.5 min forward/2.5 min backward Backwards walking on the treadmill, 2.5 min, 0.8 speed  Standing on incline board for calf and hamstring, 30 sec bouts x4 Seated leg press, 85# x10, second set: 95# x10 with green physioball for increased hip adduction Standing hip flexion at Matrix, 70#, 2x10 Standing hip abduction at Matrix, 70#, 2x10 Standing  hip extension at Matrix, 70#, 2x10 Seated leg extension at OMEGA, 35#, x10; 45#, x10 Seated hamstring curls at OMEGA, 45#, x10; 55#, x10 Standing terminal knee extension into green physioball, 3 sec holds, x10      PATIENT EDUCATION:  Education details: Patient was educated on diagnosis, anatomy and pathology involved, prognosis, role of PT, and was given an HEP, demonstrating exercise with proper form following verbal and tactile cues, and was given a paper hand out to continue exercise at home. Pt was educated on and agreed to plan of care.  Person educated: Patient Education method: Explanation, Demonstration, and Verbal cues Education comprehension: verbalized understanding, returned demonstration, and verbal cues required  HOME EXERCISE PROGRAM: [id number]  ASSESSMENT:  CLINICAL IMPRESSION:  Pt has made significant improvement with goals and has met 3/4 current goals.  Pt does have limitations in ROM, however is within the functional range of motion of the knee.  Pt encouraged to continue with HEP and to move to a gym based exercise program that would improve the strength in his L knee  moving forward.  Pt will be d/c at this time.  Pt advised to contact clinic if he has any questions or concerns moving forward.        OBJECTIVE IMPAIRMENTS: carrying, lifting, bending, standing, squatting, stairs, transfers, bed mobility, bathing, dressing, hygiene/grooming, and caring for others.   ACTIVITY LIMITATIONS: carrying, lifting, bending, sitting, standing, squatting, sleeping, transfers, bed mobility, bathing, toileting, and locomotion level  PARTICIPATION LIMITATIONS: meal prep, cleaning, laundry, shopping, community activity, and yard work  PERSONAL FACTORS: Age, Fitness, Past/current experiences, Time since onset of injury/illness/exacerbation, and 3+ comorbidities: HLD, HTN, athritis, prostate cancer  are also affecting patient's functional outcome.   REHAB POTENTIAL: Good  CLINICAL DECISION MAKING: Evolving/moderate complexity  EVALUATION COMPLEXITY: Moderate   GOALS: Goals reviewed with patient? Yes  SHORT TERM GOALS: Target date: 10/08/22 Pt will be independent with HEP in order to improve strength and balance in order to decrease fall risk and improve function at home and work.  Baseline: HEP given; 10/11/22 HEP updated Goal status: ongoing   LONG TERM GOALS: Target date: 11/17/22  Pt will demonstrate L knee mobility of 0-120d in order to demonstrate mobility needed for normalized gait Baseline: 22-75;  10/11/22: 20-109 11/06/22: 18-109 deg 11/15/22: 17-111 deg 11/22/22: 15-111 deg Goal status: NOT MET  2.  Patient will increase FOTO score to 61 to demonstrate predicted increase in functional mobility to complete ADLs Baseline: 36 11/08/22: 63 11/15/22: 62 11/22/22: 72 Goal status: MET but continue to monitor  3.  Pt will demonstrate 5TSTS by at least 13 seconds in order to demonstrate age matched LE strength needed for ind transfers Baseline: 40 sec 11/08/22: 14.34 sec 11/15/22: 11.23 sec 11/22/22: 10.43 Goal status: MET  4.  Pt will increase to at least  1.42m/s in order to demonstrate community ambulation speed with decreased fall risk  Baseline: self selected: 0.24m/s fastest: 0.20m/s 11/08/22: self selected: 0.19m/s; fastest: 0.70m/s 11/15/22: self selected: 0.80m/s; fastest: 1.84m/s 11/22/22: self selected: 0.98 m/s; fastest: 1.10 m/s Goal status: MET     PLAN:  PT FREQUENCY: 1-2x/week  PT DURATION: 8 weeks  PLANNED INTERVENTIONS: Therapeutic exercises, Therapeutic activity, Neuromuscular re-education, Balance training, Gait training, Patient/Family education, Self Care, Joint mobilization, Joint manipulation, Stair training, Dry Needling, and Electrical stimulation  PLAN FOR NEXT SESSION:  d/c at this time    Nolon Bussing, PT, DPT Physical Therapist - [facility]  [facility]  11/22/22,  10:30 AM

## 2022-12-12 DIAGNOSIS — Z125 Encounter for screening for malignant neoplasm of prostate: Secondary | ICD-10-CM | POA: Diagnosis not present

## 2022-12-12 DIAGNOSIS — I10 Essential (primary) hypertension: Secondary | ICD-10-CM | POA: Diagnosis not present

## 2022-12-12 DIAGNOSIS — E785 Hyperlipidemia, unspecified: Secondary | ICD-10-CM | POA: Diagnosis not present

## 2022-12-12 DIAGNOSIS — R7301 Impaired fasting glucose: Secondary | ICD-10-CM | POA: Diagnosis not present

## 2022-12-12 DIAGNOSIS — R7989 Other specified abnormal findings of blood chemistry: Secondary | ICD-10-CM | POA: Diagnosis not present

## 2022-12-19 DIAGNOSIS — H6123 Impacted cerumen, bilateral: Secondary | ICD-10-CM | POA: Diagnosis not present

## 2022-12-19 DIAGNOSIS — Z96652 Presence of left artificial knee joint: Secondary | ICD-10-CM | POA: Diagnosis not present

## 2022-12-19 DIAGNOSIS — R82998 Other abnormal findings in urine: Secondary | ICD-10-CM | POA: Diagnosis not present

## 2022-12-19 DIAGNOSIS — I10 Essential (primary) hypertension: Secondary | ICD-10-CM | POA: Diagnosis not present

## 2022-12-19 DIAGNOSIS — R7301 Impaired fasting glucose: Secondary | ICD-10-CM | POA: Diagnosis not present

## 2022-12-19 DIAGNOSIS — Z0000 Encounter for general adult medical examination without abnormal findings: Secondary | ICD-10-CM | POA: Diagnosis not present

## 2022-12-19 DIAGNOSIS — H9193 Unspecified hearing loss, bilateral: Secondary | ICD-10-CM | POA: Diagnosis not present

## 2022-12-19 DIAGNOSIS — E785 Hyperlipidemia, unspecified: Secondary | ICD-10-CM | POA: Diagnosis not present

## 2022-12-19 DIAGNOSIS — C61 Malignant neoplasm of prostate: Secondary | ICD-10-CM | POA: Diagnosis not present

## 2023-01-29 ENCOUNTER — Encounter: Payer: Self-pay | Admitting: Orthopaedic Surgery

## 2023-01-29 ENCOUNTER — Other Ambulatory Visit (INDEPENDENT_AMBULATORY_CARE_PROVIDER_SITE_OTHER): Payer: Medicare PPO

## 2023-01-29 ENCOUNTER — Ambulatory Visit: Payer: Medicare PPO | Admitting: Orthopaedic Surgery

## 2023-01-29 DIAGNOSIS — Z96652 Presence of left artificial knee joint: Secondary | ICD-10-CM | POA: Diagnosis not present

## 2023-01-29 NOTE — Progress Notes (Signed)
The patient will be 82 on Wednesday of this week.  He is now 5 months status post a left total knee arthroplasty.  He says he is doing well.  He still has moderate swelling of his left knee as well as foot and ankle.  His calf is soft.  He lacks full extension by about 5 degrees but I can flex him to about 115 degrees.  The knee feels stable ligamentously.  2 views of the left knee show well-seated cemented total knee arthroplasty with no complicating features.  He seems to be happy that he is doing well otherwise.  He knows that it can take a full year to completely recover from knee replacement surgery.  With that being said I would like to see him back in 6 months but no x-rays are needed unless there is issues.  This is more to see what his exam is like.

## 2023-03-01 DIAGNOSIS — L821 Other seborrheic keratosis: Secondary | ICD-10-CM | POA: Diagnosis not present

## 2023-03-01 DIAGNOSIS — D225 Melanocytic nevi of trunk: Secondary | ICD-10-CM | POA: Diagnosis not present

## 2023-03-01 DIAGNOSIS — L905 Scar conditions and fibrosis of skin: Secondary | ICD-10-CM | POA: Diagnosis not present

## 2023-03-01 DIAGNOSIS — L814 Other melanin hyperpigmentation: Secondary | ICD-10-CM | POA: Diagnosis not present

## 2023-08-01 ENCOUNTER — Ambulatory Visit: Payer: Medicare PPO | Admitting: Orthopaedic Surgery

## 2023-08-22 ENCOUNTER — Ambulatory Visit: Payer: Medicare PPO | Admitting: Orthopaedic Surgery

## 2023-09-12 ENCOUNTER — Ambulatory Visit (INDEPENDENT_AMBULATORY_CARE_PROVIDER_SITE_OTHER): Payer: Medicare PPO | Admitting: Orthopaedic Surgery

## 2023-09-12 ENCOUNTER — Encounter: Payer: Self-pay | Admitting: Orthopaedic Surgery

## 2023-09-12 DIAGNOSIS — Z96652 Presence of left artificial knee joint: Secondary | ICD-10-CM | POA: Diagnosis not present

## 2023-09-12 NOTE — Progress Notes (Signed)
 HPI: Robert [name] returns today status post left total knee arthroplasty 08/25/2022.  He states that "I am almost all the way there".  He still has some stiffness at times.  He has started using machine at home sounds like an elliptical machine.  He is using his found this beneficial.  He is having no real pain in the knee.  He states he cannot crawl on the knee like under a house due to discomfort.  Review of systems : See HPI otherwise negative  Physical exam: General Well-developed well-nourished male no acute distress.  Ambulates without any assistive device. Respirations: Non labored  Left knee: Surgical incisions well-healed no abnormal warmth erythema.  Lacks full extension by approximately 2 to 3 degrees.  Flexion 115 degrees.  No instability valgus varus stressing.  Impression: Status post left total knee arthroplasty 08/25/2022  Plan: He will follow-up with [place] as needed.  Encouraged him to continue to work on Dance movement psychotherapist.  Questions and exercises shown.  Questions were encouraged and answered by Dr. Magnus Ivan and myself.

## 2024-01-01 DIAGNOSIS — R7301 Impaired fasting glucose: Secondary | ICD-10-CM | POA: Diagnosis not present

## 2024-01-01 DIAGNOSIS — E7849 Other hyperlipidemia: Secondary | ICD-10-CM | POA: Diagnosis not present

## 2024-01-01 DIAGNOSIS — E785 Hyperlipidemia, unspecified: Secondary | ICD-10-CM | POA: Diagnosis not present

## 2024-01-01 DIAGNOSIS — I10 Essential (primary) hypertension: Secondary | ICD-10-CM | POA: Diagnosis not present

## 2024-01-01 DIAGNOSIS — Z8546 Personal history of malignant neoplasm of prostate: Secondary | ICD-10-CM | POA: Diagnosis not present

## 2024-01-01 DIAGNOSIS — Z125 Encounter for screening for malignant neoplasm of prostate: Secondary | ICD-10-CM | POA: Diagnosis not present

## 2024-01-08 DIAGNOSIS — R7301 Impaired fasting glucose: Secondary | ICD-10-CM | POA: Diagnosis not present

## 2024-01-08 DIAGNOSIS — Z0000 Encounter for general adult medical examination without abnormal findings: Secondary | ICD-10-CM | POA: Diagnosis not present

## 2024-01-08 DIAGNOSIS — Z1339 Encounter for screening examination for other mental health and behavioral disorders: Secondary | ICD-10-CM | POA: Diagnosis not present

## 2024-01-08 DIAGNOSIS — E785 Hyperlipidemia, unspecified: Secondary | ICD-10-CM | POA: Diagnosis not present

## 2024-01-08 DIAGNOSIS — R82998 Other abnormal findings in urine: Secondary | ICD-10-CM | POA: Diagnosis not present

## 2024-01-08 DIAGNOSIS — H6123 Impacted cerumen, bilateral: Secondary | ICD-10-CM | POA: Diagnosis not present

## 2024-01-08 DIAGNOSIS — E669 Obesity, unspecified: Secondary | ICD-10-CM | POA: Diagnosis not present

## 2024-01-08 DIAGNOSIS — Z8546 Personal history of malignant neoplasm of prostate: Secondary | ICD-10-CM | POA: Diagnosis not present

## 2024-01-08 DIAGNOSIS — Z1331 Encounter for screening for depression: Secondary | ICD-10-CM | POA: Diagnosis not present

## 2024-01-08 DIAGNOSIS — I10 Essential (primary) hypertension: Secondary | ICD-10-CM | POA: Diagnosis not present

## 2024-03-03 DIAGNOSIS — L821 Other seborrheic keratosis: Secondary | ICD-10-CM | POA: Diagnosis not present

## 2024-03-03 DIAGNOSIS — L570 Actinic keratosis: Secondary | ICD-10-CM | POA: Diagnosis not present

## 2024-03-03 DIAGNOSIS — L728 Other follicular cysts of the skin and subcutaneous tissue: Secondary | ICD-10-CM | POA: Diagnosis not present

## 2024-03-03 DIAGNOSIS — L814 Other melanin hyperpigmentation: Secondary | ICD-10-CM | POA: Diagnosis not present

## 2024-03-03 DIAGNOSIS — D225 Melanocytic nevi of trunk: Secondary | ICD-10-CM | POA: Diagnosis not present

## 2024-05-12 ENCOUNTER — Encounter: Payer: Self-pay | Admitting: Radiology
